# Patient Record
Sex: Male | Born: 1955 | Race: Asian | Hispanic: No | Marital: Married | Smoking: Never smoker
Health system: Southern US, Community
[De-identification: ages and names within clinical notes are randomized; demographics above are authoritative.]

## PROBLEM LIST (undated history)

## (undated) DIAGNOSIS — Z789 Other specified health status: Secondary | ICD-10-CM

## (undated) HISTORY — PX: NO PAST SURGERIES: SHX2092

---

## 2010-05-16 ENCOUNTER — Encounter: Payer: Self-pay | Admitting: Family Medicine

## 2010-05-16 ENCOUNTER — Ambulatory Visit (INDEPENDENT_AMBULATORY_CARE_PROVIDER_SITE_OTHER): Payer: Self-pay | Admitting: Family Medicine

## 2010-05-16 DIAGNOSIS — H8110 Benign paroxysmal vertigo, unspecified ear: Secondary | ICD-10-CM

## 2010-05-18 ENCOUNTER — Telehealth (INDEPENDENT_AMBULATORY_CARE_PROVIDER_SITE_OTHER): Payer: Self-pay | Admitting: *Deleted

## 2010-05-25 NOTE — Assessment & Plan Note (Signed)
Summary: feeling anxious/tm(rm4)   Vital Signs:  Patient Profile:   55 Years Old Male CC:      not feeling well/ infection in blood? Height:     72 inches Weight:      255.8 pounds O2 Sat:      95 % O2 treatment:    Room Air Temp:     97.9 degrees F oral Pulse rate:   61 / minute Resp:     20 per minute BP sitting:   123 / 70  (left arm) Cuff size:   regular  Vitals Entered By: Linton Flemings RN (May 16, 2010 12:21 PM)                  Updated Prior Medication List: No Medications Current Allergies: No known allergies History of Present Illness Chief Complaint: not feeling well/ infection in blood? History of Present Illness:  Subjective:  Patient complains of awakening 3 days ago feeling mildly dizzy.  The symptoms have gradually increased and now occur with any movement.  He feels as if the room is spinning slightly and he feels off balance when walking.  No changes in vision.  No headache.  He has had mild nausea but no vomiting.  No fevers, chills, and sweats.   No neuro symptoms   REVIEW OF SYSTEMS Constitutional Symptoms      Denies fever, chills, night sweats, weight loss, weight gain, and fatigue.  Eyes       Denies change in vision, eye pain, eye discharge, glasses, contact lenses, and eye surgery. Ear/Nose/Throat/Mouth       Denies hearing loss/aids, change in hearing, ear pain, ear discharge, dizziness, frequent runny nose, frequent nose bleeds, sinus problems, sore throat, hoarseness, and tooth pain or bleeding.  Respiratory       Denies dry cough, productive cough, wheezing, shortness of breath, asthma, bronchitis, and emphysema/COPD.  Cardiovascular       Denies murmurs, chest pain, and tires easily with exhertion.    Gastrointestinal       Complains of nausea/vomiting.      Denies stomach pain, diarrhea, constipation, blood in bowel movements, and indigestion. Genitourniary       Denies painful urination, kidney stones, and loss of urinary  control. Neurological       Denies paralysis, seizures, and fainting/blackouts. Musculoskeletal       Denies muscle pain, joint pain, joint stiffness, decreased range of motion, redness, swelling, muscle weakness, and gout.  Skin       Denies bruising, unusual mles/lumps or sores, and hair/skin or nail changes.  Psych       Denies mood changes, temper/anger issues, anxiety/stress, speech problems, depression, and sleep problems. Other Comments: states for 3 days he has felt dizzyand has not been able to control his body. yesterday he tried to put his foot in a particular spot and was unable to.  Also states he noticed the feeling when he lay on right side is when he feels dizzy   Past History:  Past Medical History: Unremarkable  Past Surgical History: Denies surgical history  Family History: none  Social History: non smoker drinks-casual drugs-no   Objective:  Appearance:  Patient appears healthy, stated age, and in no acute distress  Eyes:  Pupils are equal, round, and reactive to light and accomdation.  Extraocular movement is intact.  Conjunctivae are not inflamed.  Fundi benign.  Slight nystagmus present on lateral gaze Ears:  Canals normal.  Tympanic membranes normal.  Nose:  Not congested; no sinus tenderness Pharynx:  Normal  Neck:  Supple.  No adenopathy is present.  No thyromegaly is present.  Carotids normal without bruits Heart:  Regular rate and rhythm without murmurs, rubs, or gallops.  Lungs:  Clear to auscultation.  Breath sounds are equal.  Abdomen:  Nontender without masses or hepatosplenomegaly.  Bowel sounds are present.  No CVA or flank tenderness.  Extremities:  No edema.  Pedal pulses are full and equal.  Neurologic:  Cranial nerves 2 through 12 are normal.  Patellar, achilles reflexes are normal.  Cerebellar function is intact.  Gait and station are normal.    Assessment New Problems: BENIGN POSITIONAL VERTIGO (ICD-386.11)   Plan New  Medications/Changes: MECLIZINE HCL 25 MG TABS (MECLIZINE HCL) One by mouth two times a day to three times a day as needed for dizziness  #15 x 1, 05/16/2010, Donna Christen MD  New Orders: New Patient Level III [phone] Planning Comments:   Begin antivert.  Given a Water quality scientist patient information and instruction sheet on topic  Follow-up with ENT if not improving about two weeks                                                                                                                                                                       The patient and/or caregiver has been counseled thoroughly with regard to medications prescribed including dosage, schedule, interactions, rationale for use, and possible side effects and they verbalize understanding.  Diagnoses and expected course of recovery discussed and will return if not improved as expected or if the condition worsens. Patient and/or caregiver verbalized understanding.  Prescriptions: MECLIZINE HCL 25 MG TABS (MECLIZINE HCL) One by mouth two times a day to three times a day as needed for dizziness  #15 x 1   Entered and Authorized by:   Donna Christen MD   Signed by:   Donna Christen MD on 05/16/2010   Method used:   Print then Give to Patient   RxID:   [id number]   Orders Added: 1)  New Patient Level III [95621]

## 2010-05-25 NOTE — Progress Notes (Signed)
  Phone Note Outgoing Call Call back at [facility] Phone [phone] P [facility]     Call placed by: Lajean Saver RN,  May 18, 2010 12:56 PM Call placed to: Patient Action Taken: Phone Call Completed Summary of Call: Callback: Patient reports he is feeling "much better"

## 2010-05-25 NOTE — Letter (Signed)
Summary: Out of Work  [facility]  [address]   [place], [place] [address]   Phone: [phone]  Fax: [phone]    May 16, 2010   Employee:  [employer]    To Whom It May Concern:   For Medical reasons, please excuse the above named employee from work tomorrow.      If you need additional information, please feel free to contact our office.         Sincerely,    Donna Christen MD

## 2011-11-10 ENCOUNTER — Other Ambulatory Visit: Payer: Self-pay | Admitting: Orthopedic Surgery

## 2011-11-10 ENCOUNTER — Encounter (HOSPITAL_BASED_OUTPATIENT_CLINIC_OR_DEPARTMENT_OTHER): Payer: Self-pay | Admitting: *Deleted

## 2011-11-10 NOTE — Progress Notes (Signed)
Pt here -denies any cardiac or resp problems-no surgeries-from [place]-speaks english very well-works [employer]

## 2011-11-11 ENCOUNTER — Encounter (HOSPITAL_BASED_OUTPATIENT_CLINIC_OR_DEPARTMENT_OTHER): Payer: Self-pay | Admitting: *Deleted

## 2011-11-11 ENCOUNTER — Ambulatory Visit (HOSPITAL_BASED_OUTPATIENT_CLINIC_OR_DEPARTMENT_OTHER): Admitting: *Deleted

## 2011-11-11 ENCOUNTER — Encounter (HOSPITAL_BASED_OUTPATIENT_CLINIC_OR_DEPARTMENT_OTHER)
Admission: RE | Disposition: A | Discharge status: Home / Self Care | Payer: Self-pay | Source: Ambulatory Visit | Attending: Orthopedic Surgery

## 2011-11-11 ENCOUNTER — Ambulatory Visit (HOSPITAL_BASED_OUTPATIENT_CLINIC_OR_DEPARTMENT_OTHER)
Admission: RE | Admit: 2011-11-11 | Discharge: 2011-11-11 | Disposition: A | Discharge status: Home / Self Care | Source: Ambulatory Visit | Attending: Orthopedic Surgery | Admitting: Orthopedic Surgery

## 2011-11-11 DIAGNOSIS — W19XXXA Unspecified fall, initial encounter: Secondary | ICD-10-CM | POA: Insufficient documentation

## 2011-11-11 DIAGNOSIS — S52599A Other fractures of lower end of unspecified radius, initial encounter for closed fracture: Secondary | ICD-10-CM | POA: Insufficient documentation

## 2011-11-11 HISTORY — PX: ORIF WRIST FRACTURE: SHX2133

## 2011-11-11 HISTORY — DX: Other specified health status: Z78.9

## 2011-11-11 SURGERY — OPEN REDUCTION INTERNAL FIXATION (ORIF) WRIST FRACTURE
Anesthesia: General | Site: Wrist | Laterality: Right | Wound class: Clean

## 2011-11-11 MED ORDER — LACTATED RINGERS IV SOLN
INTRAVENOUS | Status: DC
  Administered 2011-11-11: 08:00:00 via INTRAVENOUS

## 2011-11-11 MED ORDER — DEXAMETHASONE SODIUM PHOSPHATE 10 MG/ML IJ SOLN
INTRAMUSCULAR | Status: DC | PRN
  Administered 2011-11-11: 10 mg via INTRAVENOUS

## 2011-11-11 MED ORDER — BUPIVACAINE-EPINEPHRINE PF 0.5-1:200000 % IJ SOLN
INTRAMUSCULAR | Status: DC | PRN
  Administered 2011-11-11: 30 mL

## 2011-11-11 MED ORDER — ROPIVACAINE HCL 5 MG/ML IJ SOLN
INTRAMUSCULAR | Status: DC | PRN
  Administered 2011-11-11: 10 mL via EPIDURAL

## 2011-11-11 MED ORDER — CEFAZOLIN SODIUM 1-5 GM-% IV SOLN
1.0000 g | Freq: Once | INTRAVENOUS | Status: AC
  Administered 2011-11-11: 1 g via INTRAVENOUS

## 2011-11-11 MED ORDER — POVIDONE-IODINE 7.5 % EX SOLN
Freq: Once | CUTANEOUS | Status: AC
  Administered 2011-11-11: 09:00:00 via TOPICAL

## 2011-11-11 MED ORDER — LIDOCAINE HCL (CARDIAC) 20 MG/ML IV SOLN
INTRAVENOUS | Status: DC | PRN
  Administered 2011-11-11: 40 mg via INTRAVENOUS

## 2011-11-11 MED ORDER — FENTANYL CITRATE 0.05 MG/ML IJ SOLN
50.0000 ug | INTRAMUSCULAR | Status: DC | PRN
  Administered 2011-11-11: 100 ug via INTRAVENOUS

## 2011-11-11 MED ORDER — OXYCODONE HCL 5 MG/5ML PO SOLN: 5.0000 mg | Freq: Once | ORAL | Status: DC | PRN

## 2011-11-11 MED ORDER — ONDANSETRON HCL 4 MG/2ML IJ SOLN
INTRAMUSCULAR | Status: DC | PRN
  Administered 2011-11-11: 4 mg via INTRAVENOUS

## 2011-11-11 MED ORDER — HYDROMORPHONE HCL PF 1 MG/ML IJ SOLN: 0.2500 mg | INTRAMUSCULAR | Status: DC | PRN

## 2011-11-11 MED ORDER — FENTANYL CITRATE 0.05 MG/ML IJ SOLN
INTRAMUSCULAR | Status: DC | PRN
  Administered 2011-11-11: 50 ug via INTRAVENOUS

## 2011-11-11 MED ORDER — MIDAZOLAM HCL 2 MG/2ML IJ SOLN
0.5000 mg | INTRAMUSCULAR | Status: DC | PRN
  Administered 2011-11-11: 2 mg via INTRAVENOUS

## 2011-11-11 MED ORDER — DEXTROSE 5 % IV SOLN
3.0000 g | INTRAVENOUS | Status: AC
  Administered 2011-11-11: 3 g via INTRAVENOUS

## 2011-11-11 MED ORDER — OXYCODONE-ACETAMINOPHEN 5-325 MG PO TABS: 1.0000 | ORAL_TABLET | Freq: Four times a day (QID) | ORAL | Status: AC | PRN

## 2011-11-11 MED ORDER — PROPOFOL 10 MG/ML IV BOLUS
INTRAVENOUS | Status: DC | PRN
  Administered 2011-11-11: 200 mg via INTRAVENOUS

## 2011-11-11 MED ORDER — OXYCODONE HCL 5 MG PO TABS: 5.0000 mg | ORAL_TABLET | Freq: Once | ORAL | Status: DC | PRN

## 2011-11-11 SURGICAL SUPPLY — 64 items
BAG DECANTER FOR FLEXI CONT (MISCELLANEOUS) IMPLANT
BANDAGE ELASTIC 3 VELCRO ST LF (GAUZE/BANDAGES/DRESSINGS) ×2 IMPLANT
BENZOIN TINCTURE PRP APPL 2/3 (GAUZE/BANDAGES/DRESSINGS) ×2 IMPLANT
BIT DRILL 2.5X110 QC LCP DISP (BIT) ×2 IMPLANT
BLADE SURG 15 STRL LF DISP TIS (BLADE) ×2 IMPLANT
BLADE SURG 15 STRL SS (BLADE) ×2
BLADE SURG ROTATE 9660 (MISCELLANEOUS) IMPLANT
BNDG ESMARK 4X9 LF (GAUZE/BANDAGES/DRESSINGS) ×2 IMPLANT
CANISTER SUCTION 1200CC (MISCELLANEOUS) ×2 IMPLANT
CLOTH BEACON ORANGE TIMEOUT ST (SAFETY) ×2 IMPLANT
COVER MAYO STAND STRL (DRAPES) ×2 IMPLANT
COVER TABLE BACK 60X90 (DRAPES) ×2 IMPLANT
CUFF TOURNIQUET SINGLE 18IN (TOURNIQUET CUFF) ×2 IMPLANT
DECANTER SPIKE VIAL GLASS SM (MISCELLANEOUS) IMPLANT
DRAPE EXTREMITY T 121X128X90 (DRAPE) ×2 IMPLANT
DRAPE OEC MINIVIEW 54X84 (DRAPES) ×2 IMPLANT
DRAPE SURG 17X23 STRL (DRAPES) ×2 IMPLANT
DRSG EMULSION OIL 3X3 NADH (GAUZE/BANDAGES/DRESSINGS) ×2 IMPLANT
DURAPREP 26ML APPLICATOR (WOUND CARE) ×2 IMPLANT
ELECT REM PT RETURN 9FT ADLT (ELECTROSURGICAL) ×2
ELECTRODE REM PT RTRN 9FT ADLT (ELECTROSURGICAL) ×1 IMPLANT
GLOVE BIOGEL M STRL SZ7.5 (GLOVE) ×2 IMPLANT
GLOVE BIOGEL PI IND STRL 8 (GLOVE) ×3 IMPLANT
GLOVE BIOGEL PI INDICATOR 8 (GLOVE) ×3
GLOVE ECLIPSE 7.5 STRL STRAW (GLOVE) ×4 IMPLANT
GOWN BRE IMP PREV XXLGXLNG (GOWN DISPOSABLE) ×2 IMPLANT
GOWN PREVENTION PLUS XLARGE (GOWN DISPOSABLE) ×2 IMPLANT
GOWN PREVENTION PLUS XXLARGE (GOWN DISPOSABLE) ×2 IMPLANT
NEEDLE HYPO 22GX1.5 SAFETY (NEEDLE) IMPLANT
NS IRRIG 1000ML POUR BTL (IV SOLUTION) ×2 IMPLANT
PACK BASIN DAY SURGERY FS (CUSTOM PROCEDURE TRAY) ×2 IMPLANT
PAD CAST 3X4 CTTN HI CHSV (CAST SUPPLIES) ×1 IMPLANT
PADDING CAST ABS 4INX4YD NS (CAST SUPPLIES) ×1
PADDING CAST ABS COTTON 4X4 ST (CAST SUPPLIES) ×1 IMPLANT
PADDING CAST COTTON 3X4 STRL (CAST SUPPLIES) ×1
PADDING UNDERCAST 2  STERILE (CAST SUPPLIES) IMPLANT
PENCIL BUTTON HOLSTER BLD 10FT (ELECTRODE) ×2 IMPLANT
PLATE SM RT 4X4X56M (Plate) ×2 IMPLANT
SCREW CANC FT 4.0X22 (Screw) ×2 IMPLANT
SCREW CANCEL 4.0 26MM (Screw) ×4 IMPLANT
SCREW CORTEX 3.5 16MM (Screw) ×3 IMPLANT
SCREW LOCK CORT ST 3.5X16 (Screw) ×3 IMPLANT
SPLINT PLASTER CAST XFAST 3X15 (CAST SUPPLIES) ×10 IMPLANT
SPLINT PLASTER XTRA FASTSET 3X (CAST SUPPLIES) ×10
SPONGE GAUZE 4X4 12PLY (GAUZE/BANDAGES/DRESSINGS) ×2 IMPLANT
STOCKINETTE 4X48 STRL (DRAPES) ×2 IMPLANT
STRIP CLOSURE SKIN 1/2X4 (GAUZE/BANDAGES/DRESSINGS) ×2 IMPLANT
SUCTION FRAZIER TIP 10 FR DISP (SUCTIONS) ×2 IMPLANT
SUT BONE WAX W31G (SUTURE) IMPLANT
SUT MNCRL AB 3-0 PS2 18 (SUTURE) ×2 IMPLANT
SUT VIC AB 1 CT1 27 (SUTURE)
SUT VIC AB 1 CT1 27XBRD ANBCTR (SUTURE) IMPLANT
SUT VIC AB 2-0 SH 27 (SUTURE) ×1
SUT VIC AB 2-0 SH 27XBRD (SUTURE) ×1 IMPLANT
SUT VIC AB 3-0 SH 27 (SUTURE) ×1
SUT VIC AB 3-0 SH 27X BRD (SUTURE) ×1 IMPLANT
SUT VICRYL 4-0 PS2 18IN ABS (SUTURE) IMPLANT
SYR BULB 3OZ (MISCELLANEOUS) ×2 IMPLANT
SYR CONTROL 10ML LL (SYRINGE) IMPLANT
TOWEL OR 17X24 6PK STRL BLUE (TOWEL DISPOSABLE) ×2 IMPLANT
TOWEL OR NON WOVEN STRL DISP B (DISPOSABLE) ×2 IMPLANT
TUBE CONNECTING 20X1/4 (TUBING) ×2 IMPLANT
UNDERPAD 30X30 INCONTINENT (UNDERPADS AND DIAPERS) ×2 IMPLANT
WATER STERILE IRR 1000ML POUR (IV SOLUTION) IMPLANT

## 2011-11-11 NOTE — Progress Notes (Signed)
Assisted Dr. Fitzgerald with right, ultrasound guided, supraclavicular, axillary block. Side rails up, monitors on throughout procedure. See vital signs in flow sheet. Tolerated Procedure well. 

## 2011-11-11 NOTE — Brief Op Note (Signed)
11/11/2011  11:01 AM  PATIENT:  [facility]  56 y.o. male  PRE-OPERATIVE DIAGNOSIS:  fractured right wrist  POST-OPERATIVE DIAGNOSIS:  fractured right wrist  PROCEDURE:  Procedure(s) (LRB): OPEN REDUCTION INTERNAL FIXATION (ORIF) WRIST FRACTURE (Right)  SURGEON:  Surgeon(s) and Role:    * Harvie Junior, MD - Primary  PHYSICIAN ASSISTANT:   ASSISTANTS: bethune   ANESTHESIA:   general  EBL:  Total I/O In: 1000 [I.V.:1000] Out: -   BLOOD ADMINISTERED:none  DRAINS: none   LOCAL MEDICATIONS USED:  MARCAINE     SPECIMEN:  No Specimen  DISPOSITION OF SPECIMEN:  N/A  COUNTS:  YES  TOURNIQUET:   Total Tourniquet Time Documented: Upper Arm (Right) - 71 minutes  DICTATION: .Other Dictation: Dictation Number [id number]  PLAN OF CARE: Discharge to home after PACU  PATIENT DISPOSITION:  PACU - hemodynamically stable.   Delay start of Pharmacological VTE agent (>24hrs) due to surgical blood loss or risk of bleeding: not applicable

## 2011-11-11 NOTE — Transfer of Care (Signed)
Immediate Anesthesia Transfer of Care Note  Patient: [facility]  Procedure(s) Performed: Procedure(s) (LRB): OPEN REDUCTION INTERNAL FIXATION (ORIF) WRIST FRACTURE (Right)  Patient Location: PACU  Anesthesia Type: GA combined with regional for post-op pain  Level of Consciousness: awake, alert  and oriented  Airway & Oxygen Therapy: Patient Spontanous Breathing and Patient connected to face mask oxygen  Post-op Assessment: Report given to PACU RN, Post -op Vital signs reviewed and stable and Patient moving all extremities  Post vital signs: Reviewed and stable  Complications: No apparent anesthesia complications

## 2011-11-11 NOTE — Anesthesia Postprocedure Evaluation (Signed)
  Anesthesia Post-op Note  Patient: [facility]  Procedure(s) Performed: Procedure(s) (LRB): OPEN REDUCTION INTERNAL FIXATION (ORIF) WRIST FRACTURE (Right)  Patient Location: PACU  Anesthesia Type: GA combined with regional for post-op pain  Level of Consciousness: awake and alert   Airway and Oxygen Therapy: Patient Spontanous Breathing  Post-op Pain: none  Post-op Assessment: Post-op Vital signs reviewed, Patient's Cardiovascular Status Stable, Respiratory Function Stable, Patent Airway and No signs of Nausea or vomiting  Post-op Vital Signs: Reviewed and stable  Complications: No apparent anesthesia complications

## 2011-11-11 NOTE — Anesthesia Procedure Notes (Addendum)
Anesthesia Regional Block:  Supraclavicular block  Pre-Anesthetic Checklist: ,, timeout performed, Correct Patient, Correct Site, Correct Laterality, Correct Procedure, Correct Position, site marked, Risks and benefits discussed, pre-op evaluation, post-op pain management  Laterality: Right  Prep: Maximum Sterile Barrier Precautions used and chloraprep       Needles:  Injection technique: Single-shot  Needle Type: Echogenic Stimulator Needle      Needle Gauge: 22 and 22 G    Additional Needles:  Procedures: ultrasound guided and nerve stimulator Supraclavicular block Narrative:  Injection made incrementally with aspirations every 5 mL. Anesthesiologist: Fitzgerald,MD  Additional Notes: 2% Lidocaine skin wheel. Intercostobrachial with 10cc of 0.5% Ropivicaine.  Supraclavicular block Procedure Name: LMA Insertion Date/Time: 11/11/2011 9:35 AM Performed by: Meyer Russel Pre-anesthesia Checklist: Patient identified, Emergency Drugs available, Suction available and Patient being monitored Patient Re-evaluated:Patient Re-evaluated prior to inductionOxygen Delivery Method: Circle System Utilized Preoxygenation: Pre-oxygenation with 100% oxygen Intubation Type: IV induction Ventilation: Mask ventilation without difficulty LMA: LMA inserted LMA Size: 5.0 Number of attempts: 1 Airway Equipment and Method: bite block Placement Confirmation: positive ETCO2 and breath sounds checked- equal and bilateral Tube secured with: Tape Dental Injury: Teeth and Oropharynx as per pre-operative assessment

## 2011-11-11 NOTE — Anesthesia Preprocedure Evaluation (Addendum)
Anesthesia Evaluation  Patient identified by MRN, date of birth, ID band Patient awake    Reviewed: Allergy & Precautions, H&P , NPO status , Patient's Chart, lab work & pertinent test results  Airway Mallampati: III TM Distance: >3 FB Neck ROM: Full    Dental No notable dental hx. (+) Teeth Intact and Dental Advisory Given   Pulmonary neg pulmonary ROS,  breath sounds clear to auscultation  Pulmonary exam normal       Cardiovascular negative cardio ROS  Rhythm:Regular Rate:Normal     Neuro/Psych negative neurological ROS  negative psych ROS   GI/Hepatic negative GI ROS, Neg liver ROS,   Endo/Other  negative endocrine ROS  Renal/GU negative Renal ROS  negative genitourinary   Musculoskeletal   Abdominal   Peds  Hematology negative hematology ROS (+)   Anesthesia Other Findings   Reproductive/Obstetrics negative OB ROS                           Anesthesia Physical Anesthesia Plan  ASA: II  Anesthesia Plan: General   Post-op Pain Management:    Induction: Intravenous  Airway Management Planned: LMA  Additional Equipment:   Intra-op Plan:   Post-operative Plan: Extubation in OR  Informed Consent: I have reviewed the patients History and Physical, chart, labs and discussed the procedure including the risks, benefits and alternatives for the proposed anesthesia with the patient or authorized representative who has indicated his/her understanding and acceptance.   Dental advisory given  Plan Discussed with: CRNA  Anesthesia Plan Comments:         Anesthesia Quick Evaluation  

## 2011-11-11 NOTE — H&P (Signed)
PREOPERATIVE H&P  Chief Complaint: r. Wrist pain  HPI: [facility] is a 56 y.o. male who presents for evaluation of r. Wrist pain. It has been present for 2 days and has been worsening.  Pt fell at work and fractured his r. Wrist. He has failed conservative measures. Pain is rated as moderate.  Past Medical History  Diagnosis Date  . No pertinent past medical history    Past Surgical History  Procedure Date  . No past surgeries    History   Social History  . Marital Status: Married    Spouse Name: N/A    Number of Children: N/A  . Years of Education: N/A   Social History Main Topics  . Smoking status: Never Smoker   . Smokeless tobacco: None  . Alcohol Use: Yes     occ  . Drug Use: No  . Sexually Active:    Other Topics Concern  . None   Social History Narrative  . None   History reviewed. No pertinent family history. No Known Allergies Prior to Admission medications   Medication Sig Start Date End Date Taking? Authorizing Provider  HYDROcodone-acetaminophen (NORCO/VICODIN) 5-325 MG per tablet Take 1 tablet by mouth every 6 (six) hours as needed.   Yes Historical Provider, MD     Positive ROS: none  All other systems have been reviewed and were otherwise negative with the exception of those mentioned in the HPI and as above.  Physical Exam: Filed Vitals:   11/11/11 0833  BP:   Pulse: 81  Temp:   Resp: 26    General: Alert, no acute distress Cardiovascular: No pedal edema Respiratory: No cyanosis, no use of accessory musculature GI: No organomegaly, abdomen is soft and non-tender Skin: No lesions in the area of chief complaint Neurologic: Sensation intact distally Psychiatric: Patient is competent for consent with normal mood and affect Lymphatic: No axillary or cervical lymphadenopathy  MUSCULOSKELETAL: r. Wrist:  Painful rom +sts over wrist  Assessment/Plan: fractured right wrist Plan for Procedure(s): OPEN REDUCTION INTERNAL FIXATION (ORIF)  WRIST FRACTURE  The risks benefits and alternatives were discussed with the patient including but not limited to the risks of nonoperative treatment, versus surgical intervention including infection, bleeding, nerve injury, malunion, nonunion, hardware prominence, hardware failure, need for hardware removal, blood clots, cardiopulmonary complications, morbidity, mortality, among others, and they were willing to proceed.  Predicted outcome is good, although there will be at least a six to nine month expected recovery.  Shakina Choy L, MD 11/11/2011 9:21 AM rwrist pain

## 2011-11-15 ENCOUNTER — Encounter (HOSPITAL_BASED_OUTPATIENT_CLINIC_OR_DEPARTMENT_OTHER): Payer: Self-pay | Admitting: Orthopedic Surgery

## 2011-11-15 NOTE — Op Note (Signed)
NAME:  [facility], [facility]               ACCOUNT NO.:  [id number]  MEDICAL RECORD NO.:  [id number]  LOCATION:                                 FACILITY:  PHYSICIAN:  Harvie Junior, M.D.        DATE OF BIRTH:  DATE OF PROCEDURE:  11/11/2011 DATE OF DISCHARGE:                              OPERATIVE REPORT   PREOPERATIVE DIAGNOSIS:  Comminuted intra-articular distal radius fracture.  POSTOPERATIVE DIAGNOSES: 1. Comminuted intra-articular distal radius fracture. 2. 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  PROCEDURE:  Open reduction and internal fixation of malunited distal radius fracture with a Synthes T-plate.  SURGEON:  Harvie Junior, M.D.  ASSISTANT:  Marshia Ly, PA  ANESTHESIA:  General.  BRIEF HISTORY:  Mr. Levingston is a 56 year old male with a history of having fallen and fractured his wrist.  X-ray showed what looked to be an old malunited wrist fracture with intra-articular extension of his new fracture fragments.  We talked to him at length.  He said he had not had any previous history of injury to his wrists.  At that point, I felt that it could just be impaction of the distal radius fracture.  We again quizzed him about this and his wife and he denied any previous history of injury to his wrist.  He was brought to the operating room for reduction and internal fixation of his intra-articular distal radius fracture.  PROCEDURE:  The patient was taken to the operating room and after adequate level of anesthesia was obtained with general anesthetic, the patient was placed supine on the operating table.  The right wrist was then prepped and draped in usual sterile fashion.  Following this, the arm was exsanguinated, blood pressure tourniquet inflated to 250 mmHg. Following this, an incision was made over the FCR tendon and the subcutaneous tissue was dissected down to the level of the FCR tendon. FCR tendon sheath was exploited and then radial to the sheath, we opened the  fascia, retracted muscles, and then got down to the palmaris quadratus which was divided on its radial border and distal radius was identified.  The palmaris was somewhat attached to the distal radius and there was an obvious malunion of the distal radius as we had somewhat anticipated looking at his previous films.  At that point, we knew that the standard DVR plate was not going to be effective in this fracture and so we opened the Synthes plating of the T-plate out and got one that we could mold to the previous malunion of his distal radius.  Once this was done, we were just going to put it as a buttress plate, but we felt that we could get some screws into that distal fragment might give him with some additional stability and we were able to get 3 cancellous screws in that distal fragment and put 3 cortical screws in the proximal fragment and this is after we sort of bent the plate to contour down to his distal radius.  Intraoperative fluoroscopy showed anatomic reduction with anatomic fit of this new plate to the distal radius.  At this point, the wound was copiously irrigated and suctioned dry.  Final images were taken.  The palmaris quadratus was repaired with 3-0 Vicryl interrupted and then the skin with 2-0 Vicryl, and 3-0 Monocryl subcuticular.  Benzoin and Steri- Strips were applied.  Sterile compressive dressing was applied and the patient was taken to recovery and noted to be in satisfactory condition. Estimated blood loss for the procedure was none.     Harvie Junior, M.D.     Ranae Plumber  D:  11/11/2011  T:  11/12/2011  Job:  [id number]

## 2012-07-19 ENCOUNTER — Other Ambulatory Visit: Payer: Self-pay | Admitting: Orthopedic Surgery

## 2012-07-23 ENCOUNTER — Encounter (HOSPITAL_BASED_OUTPATIENT_CLINIC_OR_DEPARTMENT_OTHER): Payer: Self-pay | Admitting: *Deleted

## 2012-07-23 NOTE — Progress Notes (Signed)
Pt here 8/13 for fx wrist-did well-no meds-no labs needed-denies any ht or resp problems

## 2012-07-27 ENCOUNTER — Encounter (HOSPITAL_BASED_OUTPATIENT_CLINIC_OR_DEPARTMENT_OTHER): Payer: Self-pay | Admitting: Certified Registered Nurse Anesthetist

## 2012-07-27 ENCOUNTER — Ambulatory Visit (HOSPITAL_BASED_OUTPATIENT_CLINIC_OR_DEPARTMENT_OTHER): Admitting: Certified Registered Nurse Anesthetist

## 2012-07-27 ENCOUNTER — Ambulatory Visit (HOSPITAL_BASED_OUTPATIENT_CLINIC_OR_DEPARTMENT_OTHER)
Admission: RE | Admit: 2012-07-27 | Discharge: 2012-07-27 | Disposition: A | Discharge status: Home / Self Care | Source: Ambulatory Visit | Attending: Orthopedic Surgery | Admitting: Orthopedic Surgery

## 2012-07-27 ENCOUNTER — Encounter (HOSPITAL_BASED_OUTPATIENT_CLINIC_OR_DEPARTMENT_OTHER)
Admission: RE | Disposition: A | Discharge status: Home / Self Care | Payer: Self-pay | Source: Ambulatory Visit | Attending: Orthopedic Surgery

## 2012-07-27 DIAGNOSIS — M25569 Pain in unspecified knee: Secondary | ICD-10-CM | POA: Insufficient documentation

## 2012-07-27 DIAGNOSIS — M6750 Plica syndrome, unspecified knee: Secondary | ICD-10-CM | POA: Insufficient documentation

## 2012-07-27 DIAGNOSIS — M9420 Chondromalacia, unspecified site: Secondary | ICD-10-CM | POA: Insufficient documentation

## 2012-07-27 DIAGNOSIS — M2240 Chondromalacia patellae, unspecified knee: Secondary | ICD-10-CM | POA: Insufficient documentation

## 2012-07-27 HISTORY — PX: KNEE ARTHROSCOPY: SHX127

## 2012-07-27 HISTORY — DX: Other specified health status: Z78.9

## 2012-07-27 SURGERY — ARTHROSCOPY, KNEE
Anesthesia: General | Laterality: Right | Wound class: Clean

## 2012-07-27 MED ORDER — OXYCODONE HCL 5 MG/5ML PO SOLN: 5.0000 mg | Freq: Once | ORAL | Status: DC | PRN

## 2012-07-27 MED ORDER — DEXAMETHASONE SODIUM PHOSPHATE 4 MG/ML IJ SOLN
INTRAMUSCULAR | Status: DC | PRN
  Administered 2012-07-27: 10 mg via INTRAVENOUS

## 2012-07-27 MED ORDER — POVIDONE-IODINE 7.5 % EX SOLN: Freq: Once | CUTANEOUS | Status: DC

## 2012-07-27 MED ORDER — SODIUM CHLORIDE 0.9 % IR SOLN
Status: DC | PRN
  Administered 2012-07-27: 12:00:00

## 2012-07-27 MED ORDER — BUPIVACAINE HCL (PF) 0.5 % IJ SOLN
INTRAMUSCULAR | Status: DC | PRN
  Administered 2012-07-27: 20 mL

## 2012-07-27 MED ORDER — ACETAMINOPHEN 10 MG/ML IV SOLN
1000.0000 mg | Freq: Once | INTRAVENOUS | Status: AC
  Administered 2012-07-27: 1000 mg via INTRAVENOUS

## 2012-07-27 MED ORDER — LIDOCAINE HCL (CARDIAC) 20 MG/ML IV SOLN
INTRAVENOUS | Status: DC | PRN
  Administered 2012-07-27: 60 mg via INTRAVENOUS

## 2012-07-27 MED ORDER — MIDAZOLAM HCL 5 MG/5ML IJ SOLN
INTRAMUSCULAR | Status: DC | PRN
  Administered 2012-07-27: 2 mg via INTRAVENOUS

## 2012-07-27 MED ORDER — LACTATED RINGERS IV SOLN
INTRAVENOUS | Status: DC
  Administered 2012-07-27 (×2): via INTRAVENOUS

## 2012-07-27 MED ORDER — CEFAZOLIN SODIUM-DEXTROSE 2-3 GM-% IV SOLR
2.0000 g | INTRAVENOUS | Status: AC
  Administered 2012-07-27: 2 g via INTRAVENOUS

## 2012-07-27 MED ORDER — PROPOFOL 10 MG/ML IV BOLUS
INTRAVENOUS | Status: DC | PRN
  Administered 2012-07-27: 200 mg via INTRAVENOUS

## 2012-07-27 MED ORDER — OXYCODONE-ACETAMINOPHEN 5-325 MG PO TABS: 1.0000 | ORAL_TABLET | Freq: Four times a day (QID) | ORAL | Status: DC | PRN

## 2012-07-27 MED ORDER — FENTANYL CITRATE 0.05 MG/ML IJ SOLN
INTRAMUSCULAR | Status: DC | PRN
  Administered 2012-07-27: 25 ug via INTRAVENOUS
  Administered 2012-07-27: 100 ug via INTRAVENOUS

## 2012-07-27 MED ORDER — OXYCODONE HCL 5 MG PO TABS: 5.0000 mg | ORAL_TABLET | Freq: Once | ORAL | Status: DC | PRN

## 2012-07-27 MED ORDER — HYDROMORPHONE HCL PF 1 MG/ML IJ SOLN: 0.2500 mg | INTRAMUSCULAR | Status: DC | PRN

## 2012-07-27 SURGICAL SUPPLY — 37 items
BANDAGE ELASTIC 6 VELCRO ST LF (GAUZE/BANDAGES/DRESSINGS) ×2 IMPLANT
BLADE 4.2CUDA (BLADE) IMPLANT
BLADE GREAT WHITE 4.2 (BLADE) ×2 IMPLANT
CANISTER OMNI JUG 16 LITER (MISCELLANEOUS) IMPLANT
CANISTER SUCTION 2500CC (MISCELLANEOUS) IMPLANT
CLOTH BEACON ORANGE TIMEOUT ST (SAFETY) ×2 IMPLANT
CUTTER MENISCUS  4.2MM (BLADE)
CUTTER MENISCUS 4.2MM (BLADE) IMPLANT
DRAPE ARTHROSCOPY W/POUCH 114 (DRAPES) ×2 IMPLANT
DRSG EMULSION OIL 3X3 NADH (GAUZE/BANDAGES/DRESSINGS) ×2 IMPLANT
DURAPREP 26ML APPLICATOR (WOUND CARE) ×2 IMPLANT
ELECT MENISCUS 165MM 90D (ELECTRODE) IMPLANT
ELECT REM PT RETURN 9FT ADLT (ELECTROSURGICAL)
ELECTRODE REM PT RTRN 9FT ADLT (ELECTROSURGICAL) IMPLANT
GLOVE BIOGEL PI IND STRL 8 (GLOVE) ×2 IMPLANT
GLOVE BIOGEL PI INDICATOR 8 (GLOVE) ×2
GLOVE ECLIPSE 7.5 STRL STRAW (GLOVE) ×4 IMPLANT
GOWN BRE IMP PREV XXLGXLNG (GOWN DISPOSABLE) ×2 IMPLANT
GOWN PREVENTION PLUS XLARGE (GOWN DISPOSABLE) ×2 IMPLANT
GOWN PREVENTION PLUS XXLARGE (GOWN DISPOSABLE) ×2 IMPLANT
HOLDER KNEE FOAM BLUE (MISCELLANEOUS) ×2 IMPLANT
KNEE WRAP E Z 3 GEL PACK (MISCELLANEOUS) ×2 IMPLANT
NDL SAFETY ECLIPSE 18X1.5 (NEEDLE) IMPLANT
NEEDLE HYPO 18GX1.5 SHARP (NEEDLE)
PACK ARTHROSCOPY DSU (CUSTOM PROCEDURE TRAY) ×2 IMPLANT
PACK BASIN DAY SURGERY FS (CUSTOM PROCEDURE TRAY) ×2 IMPLANT
PAD CAST 4YDX4 CTTN HI CHSV (CAST SUPPLIES) ×1 IMPLANT
PADDING CAST COTTON 4X4 STRL (CAST SUPPLIES) ×1
PENCIL BUTTON HOLSTER BLD 10FT (ELECTRODE) IMPLANT
SET ARTHROSCOPY TUBING (MISCELLANEOUS) ×1
SET ARTHROSCOPY TUBING LN (MISCELLANEOUS) ×1 IMPLANT
SPONGE GAUZE 4X4 12PLY (GAUZE/BANDAGES/DRESSINGS) ×2 IMPLANT
SUT ETHILON 4 0 PS 2 18 (SUTURE) IMPLANT
SYR 5ML LL (SYRINGE) ×2 IMPLANT
TOWEL OR 17X24 6PK STRL BLUE (TOWEL DISPOSABLE) ×2 IMPLANT
TOWEL OR NON WOVEN STRL DISP B (DISPOSABLE) ×2 IMPLANT
WATER STERILE IRR 1000ML POUR (IV SOLUTION) ×2 IMPLANT

## 2012-07-27 NOTE — Anesthesia Preprocedure Evaluation (Addendum)
Anesthesia Evaluation  Patient identified by MRN, date of birth, ID band Patient awake    Reviewed: Allergy & Precautions, H&P , NPO status , Patient's Chart, lab work & pertinent test results  Airway Mallampati: II TM Distance: >3 FB Neck ROM: Full    Dental no notable dental hx. (+) Teeth Intact and Dental Advisory Given   Pulmonary neg pulmonary ROS,  breath sounds clear to auscultation  Pulmonary exam normal       Cardiovascular negative cardio ROS  Rhythm:Regular Rate:Normal     Neuro/Psych negative neurological ROS  negative psych ROS   GI/Hepatic negative GI ROS, Neg liver ROS,   Endo/Other  negative endocrine ROS  Renal/GU negative Renal ROS  negative genitourinary   Musculoskeletal   Abdominal   Peds  Hematology negative hematology ROS (+)   Anesthesia Other Findings   Reproductive/Obstetrics negative OB ROS                           Anesthesia Physical Anesthesia Plan  ASA: II  Anesthesia Plan: General   Post-op Pain Management:    Induction: Intravenous  Airway Management Planned: LMA  Additional Equipment:   Intra-op Plan:   Post-operative Plan: Extubation in OR  Informed Consent: I have reviewed the patients History and Physical, chart, labs and discussed the procedure including the risks, benefits and alternatives for the proposed anesthesia with the patient or authorized representative who has indicated his/her understanding and acceptance.   Dental advisory given  Plan Discussed with: CRNA  Anesthesia Plan Comments:         Anesthesia Quick Evaluation  

## 2012-07-27 NOTE — Brief Op Note (Signed)
07/27/2012  12:18 PM  PATIENT:  Marquinn Molter  57 y.o. male  PRE-OPERATIVE DIAGNOSIS:  chondral defect, medial femoral chondyle right knee  POST-OPERATIVE DIAGNOSIS:  chondral defect, medial femoral chondyle right knee  PROCEDURE:  Procedure(s): ARTHROSCOPY KNEE, CHONDROPLASTY MEDIAL COMPARTMENT, MEDIAL PATELLA COMPARTMENT, EXCISION MEDIAL PLICA  (Right)  SURGEON:  Surgeon(s) and Role:    * Harvie Junior, MD - Primary  PHYSICIAN ASSISTANT:   ASSISTANTS: bethune   ANESTHESIA:   general  EBL:  Total I/O In: 1000 [I.V.:1000] Out: -   BLOOD ADMINISTERED:none  DRAINS: none   LOCAL MEDICATIONS USED:  MARCAINE     SPECIMEN:  No Specimen  DISPOSITION OF SPECIMEN:  N/A  COUNTS:  YES  TOURNIQUET:  * No tourniquets in log *  DICTATION: .Other Dictation: Dictation Number [id number]  PLAN OF CARE: Discharge to home after PACU  PATIENT DISPOSITION:  PACU - hemodynamically stable.   Delay start of Pharmacological VTE agent (>24hrs) due to surgical blood loss or risk of bleeding: no

## 2012-07-27 NOTE — H&P (Signed)
  PREOPERATIVE H&P  Chief Complaint: r knee pain  HPI: Patrick [facility] is a 57 y.o. male who presents for evaluation of r knee pain. It has been present for greater than 3 months and has been worsening. He has failed conservative measures. Pain is rated as moderate.  Past Medical History  Diagnosis Date  . No pertinent past medical history   . Medical history non-contributory    Past Surgical History  Procedure Laterality Date  . No past surgeries    . Orif wrist fracture  11/11/2011    Procedure: OPEN REDUCTION INTERNAL FIXATION (ORIF) WRIST FRACTURE;  Surgeon: Patrick Junior, MD;  Location: [facility];  Service: Orthopedics;  Laterality: Right;  orif right wrist   History   Social History  . Marital Status: Married    Spouse Name: N/A    Number of Children: N/A  . Years of Education: N/A   Social History Main Topics  . Smoking status: Never Smoker   . Smokeless tobacco: None  . Alcohol Use: Yes     Comment: occ  . Drug Use: No  . Sexually Active: None   Other Topics Concern  . None   Social History Narrative  . None   History reviewed. No pertinent family history. No Known Allergies Prior to Admission medications   Medication Sig Start Date End Date Taking? Authorizing Provider  HYDROcodone-acetaminophen (VICODIN) 5-500 MG per tablet Take 1 tablet by mouth every 6 (six) hours as needed for pain.   Yes Historical Provider, MD     Positive ROS: none  All other systems have been reviewed and were otherwise negative with the exception of those mentioned in the HPI and as above.  Physical Exam: Filed Vitals:   07/27/12 0940  BP: 120/79  Pulse: 60  Temp: 98.4 F (36.9 C)  Resp: 18    General: Alert, no acute distress Cardiovascular: No pedal edema Respiratory: No cyanosis, no use of accessory musculature GI: No organomegaly, abdomen is soft and non-tender Skin: No lesions in the area of chief complaint Neurologic: Sensation intact  distally Psychiatric: Patient is competent for consent with normal mood and affect Lymphatic: No axillary or cervical lymphadenopathy  MUSCULOSKELETAL: r knee: +med jt line tender // +compression and inhibition  Assessment/Plan: chondral defect, medial femoral chondyle right knee with med side pain Plan for Procedure(s): ARTHROSCOPY KNEE  The risks benefits and alternatives were discussed with the patient including but not limited to the risks of nonoperative treatment, versus surgical intervention including infection, bleeding, nerve injury, malunion, nonunion, hardware prominence, hardware failure, need for hardware removal, blood clots, cardiopulmonary complications, morbidity, mortality, among others, and they were willing to proceed.  Predicted outcome is good, although there will be at least a six to nine month expected recovery.  [facility] L, MD 07/27/2012 11:27 AM

## 2012-07-27 NOTE — Anesthesia Procedure Notes (Signed)
Procedure Name: LMA Insertion Date/Time: 07/27/2012 11:42 AM Performed by: Karsynn Deweese D Pre-anesthesia Checklist: Patient identified, Emergency Drugs available, Suction available and Patient being monitored Patient Re-evaluated:Patient Re-evaluated prior to inductionOxygen Delivery Method: Circle System Utilized Preoxygenation: Pre-oxygenation with 100% oxygen Intubation Type: IV induction Ventilation: Mask ventilation without difficulty LMA: LMA inserted LMA Size: 5.0 Number of attempts: 1 Airway Equipment and Method: bite block Placement Confirmation: positive ETCO2 Tube secured with: Tape Dental Injury: Teeth and Oropharynx as per pre-operative assessment

## 2012-07-27 NOTE — Anesthesia Postprocedure Evaluation (Signed)
Anesthesia Post Note  Patient: Patrick Hill  Procedure(s) Performed: Procedure(s) (LRB): ARTHROSCOPY KNEE, CHONDROPLASTY MEDIAL COMPARTMENT, MEDIAL PATELLA COMPARTMENT, EXCISION MEDIAL PLICA  (Right)  Anesthesia type: general  Patient location: PACU  Post pain: Pain level controlled  Post assessment: Patient's Cardiovascular Status Stable  Last Vitals:  Filed Vitals:   07/27/12 1330  BP: 142/97  Pulse: 57  Temp:   Resp: 18    Post vital signs: Reviewed and stable  Level of consciousness: sedated  Complications: No apparent anesthesia complications

## 2012-07-27 NOTE — Transfer of Care (Signed)
Immediate Anesthesia Transfer of Care Note  Patient: Patrick Hill  Procedure(s) Performed: Procedure(s): ARTHROSCOPY KNEE, CHONDROPLASTY MEDIAL COMPARTMENT, MEDIAL PATELLA COMPARTMENT, EXCISION MEDIAL PLICA  (Right)  Patient Location: PACU  Anesthesia Type:General  Level of Consciousness: awake and patient cooperative  Airway & Oxygen Therapy: Patient Spontanous Breathing and Patient connected to face mask oxygen  Post-op Assessment: Report given to PACU RN and Post -op Vital signs reviewed and stable  Post vital signs: Reviewed and stable  Complications: No apparent anesthesia complications

## 2012-07-30 NOTE — Op Note (Signed)
NAMEMANAN, OLMO               ACCOUNT NO.:  [id number]  MEDICAL RECORD NO.:  [id number]  LOCATION:                                 FACILITY:  PHYSICIAN:  Harvie Junior, M.D.   DATE OF BIRTH:  1956-03-12  DATE OF PROCEDURE:  07/27/2012 DATE OF DISCHARGE:                              OPERATIVE REPORT   PREOPERATIVE DIAGNOSIS:  Chondral defect, medial femoral condyle with failure of conservative care.  POSTOPERATIVE DIAGNOSES: 1. Severe chondromalacia of the medial compartment with bone-on-bone     changes over a large area in the medial compartment. 2. Medial shelf plica. 3. Chondromalacia of the patellofemoral joint.  PRINCIPAL PROCEDURES: 1. Chondroplasty of medial femoral condyle down to bleeding bone. 2. Chondroplasty of patellofemoral joint down to bleeding bone. 3. Debridement of medial shelf plica.  SURGEON:  Harvie Junior, M.D.  ASSISTANT:  Marshia Ly, P.A.  ANESTHESIA:  General.  BRIEF HISTORY:  Mr. Elie is a 57 year old male with a history of having significant complaints of right knee pain.  We have treated him conservatively for a period of time.  He had had a work-related injury. X-ray showed narrowing, but not bone-on-bone changes.  MRI showed chondral thinning and edema in the bone.  After long discussion of treatment options, we felt that arthroscopy would be reasonable to address the cartilaginous lesions, the microfracture was necessary and he was brought to the operating room for this procedure.  PROCEDURE:  The patient was brought to the operating room.  After adequate level of anesthesia was obtained with general anesthetic, the patient was placed supine on the operating table.  The right leg was then prepped and draped in usual sterile fashion.  Following this, routine arthroscopic examination of the knee revealed there was significant chondromalacia of the patellofemoral joint, was debrided down to bleeding bone and areas.  Attention  was turned to the medial compartment, block by a large draping medial plica, which was debrided. There were some significant osteophytes for the medial compartment. Attention continued over medially where we got into the medial compartment, and unfortunately, there was a large 3 x 4 cm area of exposed bone on the medial femoral condyle and the medial tibial plateau.  There was about a 2 x 2 area of exposed bone.  The medial meniscus was within normal limits.  There were edge cracking of the cartilage, which we cleaned up, went to the ACL and normal lateral side showed no severe evidence of chondromalacia.  At this point, the knee was copiously and thoroughly lavaged and suctioned dry.  The arthroscopic portals were closed with bandage.  Sterile compressive dressing was applied.  The patient was taken to the recovery room, he was noted to be in satisfactory condition.  Estimated blood loss for the procedure was none.     Harvie Junior, M.D.     Ranae Plumber  D:  07/27/2012  T:  07/28/2012  Job:  [id number]

## 2016-08-13 HISTORY — PX: BACK SURGERY: SHX140

## 2016-10-04 ENCOUNTER — Other Ambulatory Visit: Payer: Self-pay | Admitting: Neurosurgery

## 2016-11-04 NOTE — Pre-Procedure Instructions (Signed)
    Patrick Hill  11/04/2016     No Pharmacies Listed   Your procedure is scheduled on Wednesday August 29.  Report to [facility] Admitting at 08:00 A.M.  Call this number if you have problems the morning of surgery:  [phone]   Remember:  Do not eat food or drink liquids after midnight.  Take these medicines the morning of surgery with A SIP OF WATER: oxycodone (Percocet) if needed  7 days prior to surgery STOP taking any Aspirin, Aleve, Naproxen, Ibuprofen, Motrin, Advil, Goody's, BC's, all herbal medications, fish oil, and all vitamins    Do not wear jewelry, make-up or nail polish.  Do not wear lotions, powders, or perfumes, or deoderant.  Do not shave 48 hours prior to surgery.  Men may shave face and neck.  Do not bring valuables to the hospital.  [facility] is not responsible for any belongings or valuables.  Contacts, dentures or bridgework may not be worn into surgery.  Leave your suitcase in the car.  After surgery it may be brought to your room.  For patients admitted to the hospital, discharge time will be determined by your treatment team.  Patients discharged the day of surgery will not be allowed to drive home.    Special instructions:    [facility]- Preparing For Surgery  Before surgery, you can play an important role. Because skin is not sterile, your skin needs to be as free of germs as possible. You can reduce the number of germs on your skin by washing with CHG (chlorahexidine gluconate) Soap before surgery.  CHG is an antiseptic cleaner which kills germs and bonds with the skin to continue killing germs even after washing.  Please do not use if you have an allergy to CHG or antibacterial soaps. If your skin becomes reddened/irritated stop using the CHG.  Do not shave (including legs and underarms) for at least 48 hours prior to first CHG shower. It is OK to shave your face.  Please follow these instructions carefully.   1. Shower the  NIGHT BEFORE SURGERY and the MORNING OF SURGERY with CHG.   2. If you chose to wash your hair, wash your hair first as usual with your normal shampoo.  3. After you shampoo, rinse your hair and body thoroughly to remove the shampoo.  4. Use CHG as you would any other liquid soap. You can apply CHG directly to the skin and wash gently with a scrungie or a clean washcloth.   5. Apply the CHG Soap to your body ONLY FROM THE NECK DOWN.  Do not use on open wounds or open sores. Avoid contact with your eyes, ears, mouth and genitals (private parts). Wash genitals (private parts) with your normal soap.  6. Wash thoroughly, paying special attention to the area where your surgery will be performed.  7. Thoroughly rinse your body with warm water from the neck down.  8. DO NOT shower/wash with your normal soap after using and rinsing off the CHG Soap.  9. Pat yourself dry with a CLEAN TOWEL.   10. Wear CLEAN PAJAMAS   11. Place CLEAN SHEETS on your bed the night of your first shower and DO NOT SLEEP WITH PETS.    Day of Surgery: Do not apply any deodorants/lotions. Please wear clean clothes to the hospital/surgery center.      Please read over the following fact sheets that you were given. MRSA Information

## 2016-11-07 ENCOUNTER — Encounter (HOSPITAL_COMMUNITY): Payer: Self-pay

## 2016-11-07 ENCOUNTER — Encounter (HOSPITAL_COMMUNITY)
Admission: RE | Admit: 2016-11-07 | Discharge: 2016-11-07 | Disposition: A | Discharge status: Home / Self Care | Source: Ambulatory Visit | Attending: Neurosurgery | Admitting: Neurosurgery

## 2016-11-07 DIAGNOSIS — Z01812 Encounter for preprocedural laboratory examination: Secondary | ICD-10-CM | POA: Insufficient documentation

## 2016-11-07 LAB — CBC
HCT: 43.8 % (ref 39.0–52.0)
Hemoglobin: 14.2 g/dL (ref 13.0–17.0)
MCH: 30 pg (ref 26.0–34.0)
MCHC: 32.4 g/dL (ref 30.0–36.0)
MCV: 92.4 fL (ref 78.0–100.0)
Platelets: 189 10*3/uL (ref 150–400)
RBC: 4.74 MIL/uL (ref 4.22–5.81)
RDW: 13.2 % (ref 11.5–15.5)
WBC: 4.4 10*3/uL (ref 4.0–10.5)

## 2016-11-07 LAB — BASIC METABOLIC PANEL
ANION GAP: 6 (ref 5–15)
BUN: 12 mg/dL (ref 6–20)
CHLORIDE: 111 mmol/L (ref 101–111)
CO2: 23 mmol/L (ref 22–32)
Calcium: 9 mg/dL (ref 8.9–10.3)
Creatinine, Ser: 1.03 mg/dL (ref 0.61–1.24)
GFR calc Af Amer: >60 mL/min (ref >60)
GLUCOSE: 95 mg/dL (ref 65–99)
POTASSIUM: 4.2 mmol/L (ref 3.5–5.1)
Sodium: 140 mmol/L (ref 135–145)

## 2016-11-07 LAB — TYPE AND SCREEN
ABO/RH(D): O POS
Antibody Screen: NEGATIVE

## 2016-11-07 LAB — ABO/RH: ABO/RH(D): O POS

## 2016-11-07 LAB — SURGICAL PCR SCREEN
MRSA, PCR: NEGATIVE
STAPHYLOCOCCUS AUREUS: POSITIVE — AB

## 2016-11-07 MED ORDER — CHLORHEXIDINE GLUCONATE CLOTH 2 % EX PADS: 6.0000 | MEDICATED_PAD | Freq: Once | CUTANEOUS | Status: DC

## 2016-11-07 NOTE — Progress Notes (Signed)
PCP - none, pt goes to [facility] in [place] but reports no PCP or health history   Patient denies shortness of breath, fever, cough and chest pain at PAT appointment   Patient verbalized understanding of instructions that were given to them at the PAT appointment. Patient was also instructed that they will need to review over the PAT instructions again at home before surgery.

## 2016-11-08 MED ORDER — DEXTROSE 5 % IV SOLN
3.0000 g | INTRAVENOUS | Status: AC
  Administered 2016-11-09 (×2): 3 g via INTRAVENOUS
  Filled 2016-11-08: qty 3000

## 2016-11-09 ENCOUNTER — Inpatient Hospital Stay (HOSPITAL_COMMUNITY)
Admission: RE | Admit: 2016-11-09 | Discharge: 2016-11-12 | DRG: 460 | Disposition: A | Discharge status: Home / Self Care | Source: Ambulatory Visit | Attending: Neurosurgery | Admitting: Neurosurgery

## 2016-11-09 ENCOUNTER — Encounter (HOSPITAL_COMMUNITY): Payer: Self-pay | Admitting: *Deleted

## 2016-11-09 ENCOUNTER — Inpatient Hospital Stay (HOSPITAL_COMMUNITY): Admitting: Certified Registered"

## 2016-11-09 ENCOUNTER — Encounter (HOSPITAL_COMMUNITY)
Admission: RE | Disposition: A | Discharge status: Home / Self Care | Payer: Self-pay | Source: Ambulatory Visit | Attending: Neurosurgery

## 2016-11-09 ENCOUNTER — Inpatient Hospital Stay (HOSPITAL_COMMUNITY)

## 2016-11-09 DIAGNOSIS — I252 Old myocardial infarction: Secondary | ICD-10-CM

## 2016-11-09 DIAGNOSIS — E119 Type 2 diabetes mellitus without complications: Secondary | ICD-10-CM | POA: Diagnosis present

## 2016-11-09 DIAGNOSIS — I10 Essential (primary) hypertension: Secondary | ICD-10-CM | POA: Diagnosis present

## 2016-11-09 DIAGNOSIS — M5126 Other intervertebral disc displacement, lumbar region: Secondary | ICD-10-CM | POA: Diagnosis present

## 2016-11-09 DIAGNOSIS — M4316 Spondylolisthesis, lumbar region: Secondary | ICD-10-CM | POA: Diagnosis present

## 2016-11-09 DIAGNOSIS — Z419 Encounter for procedure for purposes other than remedying health state, unspecified: Secondary | ICD-10-CM

## 2016-11-09 DIAGNOSIS — M48062 Spinal stenosis, lumbar region with neurogenic claudication: Secondary | ICD-10-CM | POA: Diagnosis present

## 2016-11-09 SURGERY — POSTERIOR LUMBAR FUSION 2 LEVEL
Anesthesia: General

## 2016-11-09 MED ORDER — FENTANYL CITRATE (PF) 250 MCG/5ML IJ SOLN
INTRAMUSCULAR | Status: AC
  Filled 2016-11-09: qty 5

## 2016-11-09 MED ORDER — DIAZEPAM 5 MG PO TABS
5.0000 mg | ORAL_TABLET | Freq: Four times a day (QID) | ORAL | Status: DC | PRN
  Administered 2016-11-11: 5 mg via ORAL
  Filled 2016-11-09: qty 1

## 2016-11-09 MED ORDER — MORPHINE SULFATE (PF) 2 MG/ML IV SOLN: 2.0000 mg | INTRAVENOUS | Status: DC | PRN

## 2016-11-09 MED ORDER — LIDOCAINE 2% (20 MG/ML) 5 ML SYRINGE
INTRAMUSCULAR | Status: AC
  Filled 2016-11-09: qty 5

## 2016-11-09 MED ORDER — ONDANSETRON HCL 4 MG/2ML IJ SOLN: 4.0000 mg | Freq: Four times a day (QID) | INTRAMUSCULAR | Status: DC | PRN

## 2016-11-09 MED ORDER — MAGNESIUM CITRATE PO SOLN: 1.0000 | Freq: Once | ORAL | Status: DC | PRN

## 2016-11-09 MED ORDER — ARTIFICIAL TEARS OPHTHALMIC OINT
TOPICAL_OINTMENT | OPHTHALMIC | Status: AC
  Filled 2016-11-09: qty 3.5

## 2016-11-09 MED ORDER — ONDANSETRON HCL 4 MG/2ML IJ SOLN
INTRAMUSCULAR | Status: DC | PRN
  Administered 2016-11-09: 4 mg via INTRAVENOUS

## 2016-11-09 MED ORDER — SODIUM CHLORIDE 0.9% FLUSH: 3.0000 mL | INTRAVENOUS | Status: DC | PRN

## 2016-11-09 MED ORDER — LACTATED RINGERS IV SOLN
INTRAVENOUS | Status: DC | PRN
  Administered 2016-11-09 (×3): via INTRAVENOUS

## 2016-11-09 MED ORDER — SENNA 8.6 MG PO TABS
1.0000 | ORAL_TABLET | Freq: Two times a day (BID) | ORAL | Status: DC
  Administered 2016-11-09 – 2016-11-12 (×6): 8.6 mg via ORAL
  Filled 2016-11-09 (×6): qty 1

## 2016-11-09 MED ORDER — SUGAMMADEX SODIUM 200 MG/2ML IV SOLN
INTRAVENOUS | Status: DC | PRN
  Administered 2016-11-09: 300 mg via INTRAVENOUS

## 2016-11-09 MED ORDER — OXYCODONE HCL 5 MG PO TABS
5.0000 mg | ORAL_TABLET | ORAL | Status: DC | PRN
  Administered 2016-11-09: 5 mg via ORAL
  Administered 2016-11-10 (×2): 10 mg via ORAL
  Administered 2016-11-11: 5 mg via ORAL
  Administered 2016-11-11 – 2016-11-12 (×2): 10 mg via ORAL
  Administered 2016-11-12 (×2): 5 mg via ORAL
  Filled 2016-11-09: qty 2
  Filled 2016-11-09: qty 1
  Filled 2016-11-09 (×3): qty 2
  Filled 2016-11-09 (×3): qty 1

## 2016-11-09 MED ORDER — ONDANSETRON HCL 4 MG/2ML IJ SOLN
INTRAMUSCULAR | Status: AC
  Filled 2016-11-09: qty 2

## 2016-11-09 MED ORDER — ACETAMINOPHEN 650 MG RE SUPP: 650.0000 mg | RECTAL | Status: DC | PRN

## 2016-11-09 MED ORDER — DEXAMETHASONE SODIUM PHOSPHATE 10 MG/ML IJ SOLN
INTRAMUSCULAR | Status: DC | PRN
  Administered 2016-11-09: 10 mg via INTRAVENOUS

## 2016-11-09 MED ORDER — ALBUMIN HUMAN 5 % IV SOLN
INTRAVENOUS | Status: DC | PRN
  Administered 2016-11-09: 12:00:00 via INTRAVENOUS

## 2016-11-09 MED ORDER — MIDAZOLAM HCL 2 MG/2ML IJ SOLN
INTRAMUSCULAR | Status: AC
  Filled 2016-11-09: qty 2

## 2016-11-09 MED ORDER — DEXAMETHASONE SODIUM PHOSPHATE 10 MG/ML IJ SOLN
INTRAMUSCULAR | Status: AC
  Filled 2016-11-09: qty 1

## 2016-11-09 MED ORDER — ACETAMINOPHEN 325 MG PO TABS: 650.0000 mg | ORAL_TABLET | ORAL | Status: DC | PRN

## 2016-11-09 MED ORDER — PROPOFOL 10 MG/ML IV BOLUS
INTRAVENOUS | Status: AC
  Filled 2016-11-09: qty 20

## 2016-11-09 MED ORDER — SODIUM CHLORIDE 0.9 % IV SOLN: 250.0000 mL | INTRAVENOUS | Status: DC

## 2016-11-09 MED ORDER — ROCURONIUM BROMIDE 10 MG/ML (PF) SYRINGE
PREFILLED_SYRINGE | INTRAVENOUS | Status: DC | PRN
  Administered 2016-11-09: 20 mg via INTRAVENOUS
  Administered 2016-11-09: 10 mg via INTRAVENOUS
  Administered 2016-11-09: 20 mg via INTRAVENOUS
  Administered 2016-11-09 (×3): 10 mg via INTRAVENOUS
  Administered 2016-11-09: 20 mg via INTRAVENOUS
  Administered 2016-11-09: 60 mg via INTRAVENOUS
  Administered 2016-11-09 (×2): 10 mg via INTRAVENOUS

## 2016-11-09 MED ORDER — PHENYLEPHRINE 40 MCG/ML (10ML) SYRINGE FOR IV PUSH (FOR BLOOD PRESSURE SUPPORT)
PREFILLED_SYRINGE | INTRAVENOUS | Status: AC
  Filled 2016-11-09: qty 10

## 2016-11-09 MED ORDER — ROCURONIUM BROMIDE 10 MG/ML (PF) SYRINGE
PREFILLED_SYRINGE | INTRAVENOUS | Status: AC
  Filled 2016-11-09: qty 5

## 2016-11-09 MED ORDER — ONDANSETRON HCL 4 MG PO TABS: 4.0000 mg | ORAL_TABLET | Freq: Four times a day (QID) | ORAL | Status: DC | PRN

## 2016-11-09 MED ORDER — CEFAZOLIN SODIUM 1 G IJ SOLR
INTRAMUSCULAR | Status: AC
  Filled 2016-11-09: qty 30

## 2016-11-09 MED ORDER — SODIUM CHLORIDE 0.9% FLUSH
3.0000 mL | Freq: Two times a day (BID) | INTRAVENOUS | Status: DC
  Administered 2016-11-10 – 2016-11-11 (×4): 3 mL via INTRAVENOUS

## 2016-11-09 MED ORDER — LIDOCAINE 2% (20 MG/ML) 5 ML SYRINGE
INTRAMUSCULAR | Status: DC | PRN
  Administered 2016-11-09: 100 mg via INTRAVENOUS

## 2016-11-09 MED ORDER — FENTANYL CITRATE (PF) 250 MCG/5ML IJ SOLN
INTRAMUSCULAR | Status: DC | PRN
  Administered 2016-11-09 (×2): 50 ug via INTRAVENOUS
  Administered 2016-11-09: 150 ug via INTRAVENOUS
  Administered 2016-11-09 (×2): 50 ug via INTRAVENOUS
  Administered 2016-11-09: 100 ug via INTRAVENOUS

## 2016-11-09 MED ORDER — PHENOL 1.4 % MT LIQD: 1.0000 | OROMUCOSAL | Status: DC | PRN

## 2016-11-09 MED ORDER — SUGAMMADEX SODIUM 200 MG/2ML IV SOLN
INTRAVENOUS | Status: AC
  Filled 2016-11-09: qty 2

## 2016-11-09 MED ORDER — PROMETHAZINE HCL 25 MG/ML IJ SOLN: 6.2500 mg | INTRAMUSCULAR | Status: DC | PRN

## 2016-11-09 MED ORDER — MENTHOL 3 MG MT LOZG: 1.0000 | LOZENGE | OROMUCOSAL | Status: DC | PRN

## 2016-11-09 MED ORDER — LACTATED RINGERS IV SOLN
INTRAVENOUS | Status: DC
  Administered 2016-11-09: 09:00:00 via INTRAVENOUS

## 2016-11-09 MED ORDER — MIDAZOLAM HCL 2 MG/2ML IJ SOLN
INTRAMUSCULAR | Status: DC | PRN
  Administered 2016-11-09: 2 mg via INTRAVENOUS

## 2016-11-09 MED ORDER — ARTIFICIAL TEARS OPHTHALMIC OINT
TOPICAL_OINTMENT | OPHTHALMIC | Status: DC | PRN
  Administered 2016-11-09: 1 via OPHTHALMIC

## 2016-11-09 MED ORDER — OXYCODONE HCL ER 10 MG PO T12A
10.0000 mg | EXTENDED_RELEASE_TABLET | Freq: Two times a day (BID) | ORAL | Status: DC
  Administered 2016-11-09 – 2016-11-12 (×6): 10 mg via ORAL
  Filled 2016-11-09 (×6): qty 1

## 2016-11-09 MED ORDER — SENNOSIDES-DOCUSATE SODIUM 8.6-50 MG PO TABS: 1.0000 | ORAL_TABLET | Freq: Every evening | ORAL | Status: DC | PRN

## 2016-11-09 MED ORDER — BISACODYL 5 MG PO TBEC: 5.0000 mg | DELAYED_RELEASE_TABLET | Freq: Every day | ORAL | Status: DC | PRN

## 2016-11-09 MED ORDER — PROPOFOL 10 MG/ML IV BOLUS
INTRAVENOUS | Status: DC | PRN
  Administered 2016-11-09: 200 mg via INTRAVENOUS

## 2016-11-09 MED ORDER — LIDOCAINE-EPINEPHRINE 0.5 %-1:200000 IJ SOLN
INTRAMUSCULAR | Status: AC
  Filled 2016-11-09: qty 1

## 2016-11-09 MED ORDER — THROMBIN 20000 UNITS EX SOLR
CUTANEOUS | Status: AC
  Filled 2016-11-09: qty 20000

## 2016-11-09 MED ORDER — MEPERIDINE HCL 25 MG/ML IJ SOLN: 6.2500 mg | INTRAMUSCULAR | Status: DC | PRN

## 2016-11-09 MED ORDER — HYDROMORPHONE HCL 1 MG/ML IJ SOLN: 0.2500 mg | INTRAMUSCULAR | Status: DC | PRN

## 2016-11-09 MED ORDER — POTASSIUM CHLORIDE IN NACL 20-0.9 MEQ/L-% IV SOLN
INTRAVENOUS | Status: DC
  Filled 2016-11-09: qty 1000

## 2016-11-09 MED ORDER — KETOROLAC TROMETHAMINE 30 MG/ML IJ SOLN: 30.0000 mg | Freq: Once | INTRAMUSCULAR | Status: DC | PRN

## 2016-11-09 MED ORDER — PHENYLEPHRINE 40 MCG/ML (10ML) SYRINGE FOR IV PUSH (FOR BLOOD PRESSURE SUPPORT)
PREFILLED_SYRINGE | INTRAVENOUS | Status: DC | PRN
  Administered 2016-11-09 (×5): 80 ug via INTRAVENOUS

## 2016-11-09 MED ORDER — THROMBIN 20000 UNITS EX SOLR
CUTANEOUS | Status: DC | PRN
  Administered 2016-11-09: 12:00:00 via TOPICAL

## 2016-11-09 MED ORDER — 0.9 % SODIUM CHLORIDE (POUR BTL) OPTIME
TOPICAL | Status: DC | PRN
  Administered 2016-11-09: 1000 mL

## 2016-11-09 MED ORDER — LIDOCAINE-EPINEPHRINE 0.5 %-1:200000 IJ SOLN
INTRAMUSCULAR | Status: DC | PRN
  Administered 2016-11-09: 20 mL
  Administered 2016-11-09: 30 mL

## 2016-11-09 MED ORDER — CELECOXIB 200 MG PO CAPS
200.0000 mg | ORAL_CAPSULE | Freq: Two times a day (BID) | ORAL | Status: DC
  Administered 2016-11-09 – 2016-11-12 (×6): 200 mg via ORAL
  Filled 2016-11-09 (×6): qty 1

## 2016-11-09 MED ORDER — ZOLPIDEM TARTRATE 5 MG PO TABS: 5.0000 mg | ORAL_TABLET | Freq: Every evening | ORAL | Status: DC | PRN

## 2016-11-09 SURGICAL SUPPLY — 66 items
BAG DECANTER FOR FLEXI CONT (MISCELLANEOUS) ×2 IMPLANT
BENZOIN TINCTURE PRP APPL 2/3 (GAUZE/BANDAGES/DRESSINGS) IMPLANT
BLADE CLIPPER SURG (BLADE) ×2 IMPLANT
BONE VIVIGEN FORMABLE 1.3CC (Bone Implant) ×2 IMPLANT
BUR MATCHSTICK NEURO 3.0 LAGG (BURR) ×2 IMPLANT
BUR PRECISION FLUTE 5.0 (BURR) ×2 IMPLANT
CAGE CONCORDE LIFT 9X21 (Cage) ×4 IMPLANT
CANISTER SUCT 3000ML PPV (MISCELLANEOUS) ×2 IMPLANT
CARTRIDGE OIL MAESTRO DRILL (MISCELLANEOUS) ×1 IMPLANT
CONT SPEC 4OZ CLIKSEAL STRL BL (MISCELLANEOUS) ×2 IMPLANT
COVER BACK TABLE 60X90IN (DRAPES) ×2 IMPLANT
DECANTER SPIKE VIAL GLASS SM (MISCELLANEOUS) ×2 IMPLANT
DERMABOND ADVANCED (GAUZE/BANDAGES/DRESSINGS) ×2
DERMABOND ADVANCED .7 DNX12 (GAUZE/BANDAGES/DRESSINGS) ×2 IMPLANT
DIFFUSER DRILL AIR PNEUMATIC (MISCELLANEOUS) ×2 IMPLANT
DRAPE C-ARM 42X72 X-RAY (DRAPES) ×2 IMPLANT
DRAPE C-ARMOR (DRAPES) ×2 IMPLANT
DRAPE LAPAROTOMY 100X72X124 (DRAPES) ×2 IMPLANT
DRAPE POUCH INSTRU U-SHP 10X18 (DRAPES) ×2 IMPLANT
DRAPE SURG 17X23 STRL (DRAPES) ×2 IMPLANT
DRSG OPSITE POSTOP 4X10 (GAUZE/BANDAGES/DRESSINGS) ×2 IMPLANT
DURAPREP 26ML APPLICATOR (WOUND CARE) ×2 IMPLANT
ELECT BLADE 4.0 EZ CLEAN MEGAD (MISCELLANEOUS) ×2
ELECT REM PT RETURN 9FT ADLT (ELECTROSURGICAL) ×2
ELECTRODE BLDE 4.0 EZ CLN MEGD (MISCELLANEOUS) ×1 IMPLANT
ELECTRODE REM PT RTRN 9FT ADLT (ELECTROSURGICAL) ×1 IMPLANT
GAUZE SPONGE 4X4 12PLY STRL (GAUZE/BANDAGES/DRESSINGS) IMPLANT
GAUZE SPONGE 4X4 16PLY XRAY LF (GAUZE/BANDAGES/DRESSINGS) ×2 IMPLANT
GLOVE ECLIPSE 6.5 STRL STRAW (GLOVE) ×4 IMPLANT
GLOVE EXAM NITRILE LRG STRL (GLOVE) IMPLANT
GLOVE EXAM NITRILE XL STR (GLOVE) IMPLANT
GLOVE EXAM NITRILE XS STR PU (GLOVE) IMPLANT
GOWN STRL REUS W/ TWL LRG LVL3 (GOWN DISPOSABLE) ×2 IMPLANT
GOWN STRL REUS W/ TWL XL LVL3 (GOWN DISPOSABLE) IMPLANT
GOWN STRL REUS W/TWL 2XL LVL3 (GOWN DISPOSABLE) IMPLANT
GOWN STRL REUS W/TWL LRG LVL3 (GOWN DISPOSABLE) ×2
GOWN STRL REUS W/TWL XL LVL3 (GOWN DISPOSABLE)
KIT BASIN OR (CUSTOM PROCEDURE TRAY) ×2 IMPLANT
KIT POSITION SURG JACKSON T1 (MISCELLANEOUS) ×2 IMPLANT
KIT ROOM TURNOVER OR (KITS) ×2 IMPLANT
MILL MEDIUM DISP (BLADE) ×2 IMPLANT
NEEDLE HYPO 25X1 1.5 SAFETY (NEEDLE) ×2 IMPLANT
NEEDLE SPNL 18GX3.5 QUINCKE PK (NEEDLE) ×2 IMPLANT
NS IRRIG 1000ML POUR BTL (IV SOLUTION) ×4 IMPLANT
OIL CARTRIDGE MAESTRO DRILL (MISCELLANEOUS) ×2
PACK LAMINECTOMY NEURO (CUSTOM PROCEDURE TRAY) ×2 IMPLANT
PAD ARMBOARD 7.5X6 YLW CONV (MISCELLANEOUS) ×6 IMPLANT
ROD RELINE COCR LORD 5.0X25 (Rod) ×2 IMPLANT
ROD RELINE COCR LORD 5X30 (Rod) ×2 IMPLANT
SCREW LOCK RSS 4.5/5.0MM (Screw) ×8 IMPLANT
SCREW RELINE RMM 5.5X35 4S (Screw) ×4 IMPLANT
SCREW RELINE RMM 6.5X40 4S (Screw) ×4 IMPLANT
SPONGE LAP 4X18 X RAY DECT (DISPOSABLE) IMPLANT
SPONGE SURGIFOAM ABS GEL 100 (HEMOSTASIS) ×2 IMPLANT
STRIP CLOSURE SKIN 1/2X4 (GAUZE/BANDAGES/DRESSINGS) IMPLANT
SUT PROLENE 6 0 BV (SUTURE) IMPLANT
SUT VIC AB 0 CT1 18XCR BRD8 (SUTURE) ×2 IMPLANT
SUT VIC AB 0 CT1 8-18 (SUTURE) ×2
SUT VIC AB 2-0 CT1 18 (SUTURE) ×4 IMPLANT
SUT VIC AB 3-0 SH 8-18 (SUTURE) ×8 IMPLANT
SYR BULB 3OZ (MISCELLANEOUS) ×2 IMPLANT
SYR CONTROL 10ML LL (SYRINGE) ×2 IMPLANT
TOWEL GREEN STERILE (TOWEL DISPOSABLE) ×2 IMPLANT
TOWEL GREEN STERILE FF (TOWEL DISPOSABLE) ×2 IMPLANT
TRAY FOLEY W/METER SILVER 16FR (SET/KITS/TRAYS/PACK) ×2 IMPLANT
WATER STERILE IRR 1000ML POUR (IV SOLUTION) ×2 IMPLANT

## 2016-11-09 NOTE — Anesthesia Postprocedure Evaluation (Signed)
Anesthesia Post Note  Patient: Tunis Passow  Procedure(s) Performed: Procedure(s) (LRB): POSTERIOR LUMBAR INTERBODY FUSION LUMBAR 3- LUMBAR 4, LUMBAR 4- LUMBAR 5, LUMBAR 2- LUMBAR 3 DISCECTOMY LEFT (N/A)     Patient location during evaluation: PACU Anesthesia Type: General Level of consciousness: sedated Pain management: pain level controlled Vital Signs Assessment: post-procedure vital signs reviewed and stable Respiratory status: spontaneous breathing and respiratory function stable Cardiovascular status: stable Anesthetic complications: no    Last Vitals:  Vitals:   11/09/16 1620 11/09/16 1630  BP: 117/78   Pulse: 76 80  Resp: (!) 25 (!) 23  Temp:    SpO2: 97% 97%    Last Pain:  Vitals:   11/09/16 1630  PainSc: Asleep                 Yacine Garriga DANIEL

## 2016-11-09 NOTE — Progress Notes (Signed)
Patient arrived to unit. A&O X4, reports pain of 5 on a 0-10scale. Wife and daughter at his bedside. Patient is requesting food. Food ordered-will continue to monitor.

## 2016-11-09 NOTE — Anesthesia Preprocedure Evaluation (Addendum)
Anesthesia Evaluation  Patient identified by MRN, date of birth, ID band Patient awake    Reviewed: Allergy & Precautions, H&P , NPO status , Patient's Chart, lab work & pertinent test results  Airway Mallampati: II  TM Distance: >3 FB Neck ROM: Full    Dental no notable dental hx. (+) Teeth Intact, Dental Advisory Given   Pulmonary neg pulmonary ROS,    Pulmonary exam normal breath sounds clear to auscultation       Cardiovascular negative cardio ROS Normal cardiovascular exam Rhythm:Regular Rate:Normal     Neuro/Psych negative neurological ROS  negative psych ROS   GI/Hepatic negative GI ROS, Neg liver ROS,   Endo/Other  negative endocrine ROS  Renal/GU negative Renal ROS  negative genitourinary   Musculoskeletal   Abdominal (+) + obese,   Peds  Hematology negative hematology ROS (+)   Anesthesia Other Findings   Reproductive/Obstetrics negative OB ROS                            Anesthesia Physical  Anesthesia Plan  ASA: II  Anesthesia Plan: General   Post-op Pain Management:    Induction: Intravenous  PONV Risk Score and Plan: 3 and Ondansetron, Dexamethasone, Midazolam, Propofol infusion and Treatment may vary due to age or medical condition  Airway Management Planned: Oral ETT  Additional Equipment:   Intra-op Plan:   Post-operative Plan: Extubation in OR  Informed Consent: I have reviewed the patients History and Physical, chart, labs and discussed the procedure including the risks, benefits and alternatives for the proposed anesthesia with the patient or authorized representative who has indicated his/her understanding and acceptance.   Dental advisory given  Plan Discussed with: CRNA and Surgeon  Anesthesia Plan Comments:         Anesthesia Quick Evaluation

## 2016-11-09 NOTE — Op Note (Signed)
11/09/2016  4:36 PM  PATIENT:  Patrick Hill  61 y.o. 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 male whom was taken to the operating room intubated, and placed under a general anesthetic without difficulty. A foley catheter was placed under sterile conditions. He was positioned prone on a Jackson stable with all pressure points properly padded.  His lumbar region was prepped and draped in a sterile manner. I infiltrated 30cc's 1/2%lidocaine/1:2000,000 strength epinephrine into the planned incision. I opened the skin with a 10 blade and took the incision down to the thoracolumbar fascia. I exposed the lamina of L2,3,4,and 5 in a subperiosteal fashion bilaterally. I confirmed my location with an intraoperative xray.  I placed self retaining retractors and started the decompression.  I decompressed the spinal canal at L2/3 on the left with a semihemilaminectomy on the left side. The MRI of the lumbar spine performed in March, 2018 showed a free disc fragment which had migrated caudally behind L3. I explored the disc, which was calcified and the spinal canal where I expected the fragment to be. I did not find a fragment nor disc material. I used dissectors to palpate around the  nerve root, L3, and could not express any disc material. I performed a foraminotomy at L2/3 on the left, and the root was well decompressed in the lateral recess. PLIF's were performed at L4/5 on each side in the same fashion. I opened the disc space with a 15 blade then used a variety of instruments to remove the disc and prepare the space for the arthrodesis. I used curettes, rongeurs, punches, shavers for the disc space, and rasps in the discetomy. I measured the disc space and placed adjustable synthes  Peek cages(Synthes) into the disc space(s). I packed morsels of allograft and autograft  I  placed pedicle screws at L4,and L5, using fluoroscopic guidance. I drilled a pilot hole, then cannulated the pedicle with a drill  at each site. I then tapped each pedicle, assessing each site for pedicle violations. No cutouts were appreciated. Screws (Nuvasive mas plif) were then placed at each site without difficulty. I attached rods and locking caps with the appropriate tools. The locking caps were secured with torque limited screwdrivers. Final films were performed and the final construct appeared to be in good position.  I closed the wound in a layered fashion. I approximated the thoracolumbar fascia, subcutaneous, and subcuticular planes with vicryl sutures. I used dermabond for a sterile dressing.     PLAN OF CARE: Admit to inpatient   PATIENT DISPOSITION:  PACU - hemodynamically stable.   Delay start of Pharmacological VTE agent (>24hrs) due to surgical blood loss or risk of bleeding:  yes

## 2016-11-09 NOTE — Transfer of Care (Signed)
Immediate Anesthesia Transfer of Care Note  Patient: Patrick Hill  Procedure(s) Performed: Procedure(s) with comments: POSTERIOR LUMBAR INTERBODY FUSION LUMBAR 3- LUMBAR 4, LUMBAR 4- LUMBAR 5, LUMBAR 2- LUMBAR 3 DISCECTOMY LEFT (N/A) - POSTERIOR LUMBAR INTERBODY FUSION LUMBAR FOUR- LUMBAR FIVE, NONSEGMENTALPEDICLE SCREW  FIXATION LUMBAR FOUR -LUMBAR FIVE, LUMBAR TWO- LUMBAR THREE  LEFT LAMINECTOMY AND FORAMINOTOMY  Patient Location: PACU  Anesthesia Type:General  Level of Consciousness: awake, alert  and oriented  Airway & Oxygen Therapy: Patient Spontanous Breathing and Patient connected to face mask oxygen  Post-op Assessment: Report given to RN and Post -op Vital signs reviewed and stable  Post vital signs: Reviewed and stable  Last Vitals:  Vitals:   11/09/16 0819  BP: 140/82  Pulse: 63  Resp: 20  Temp: 36.8 C  SpO2: 99%    Last Pain:  Vitals:   11/09/16 0902  PainSc: 9       Patients Stated Pain Goal: 3 (11/09/16 0902)  Complications: No apparent anesthesia complications

## 2016-11-09 NOTE — Progress Notes (Signed)
Incentive spirometer given and educated, patient achieved 2000 on IS. Will continue to monitor

## 2016-11-09 NOTE — H&P (Signed)
BP 140/82   Pulse 63   Temp 98.3 F (36.8 C)   Resp 20   Wt 122.9 kg (271 lb)   SpO2 99%   BMI 36.75 kg/m  Mr. Patrick Hill comes in today for evaluation of pain that he has in his lower back. Mr. Patrick Hill has had problems since at the very least 2016. He was seen and treated by Dr. Yevette Edwards since that time. Says he has had injections with some relief, but he has had no lasting relief. He is seeing me truly for a second opinion. He says he hurts more now in his lower back pain than he did in 2016. He has difficulty walking secondary to pain. He says if he stands for more than 15-20 minutes or walks for that time, he is in a great deal of pain. Looking at Dr. Marshell Levan notes, initially, he did not have a disc herniation at 2-3 on the left side, but at this point, he does. Dr. Yevette Edwards felt that the most recent disc herniation was probably responsible for pain that he has in the anterior thigh, which is above the knee on the left side, and he certainly believed that the stenosis at 4-5. He certainly is stenotic at 3-4, though not nearly as bad as he is at 4-5. He has facet hypertrophy at both levels. He has a mild retrolisthesis at 3-4. He has a mild anterolisthesis at 4-5.  Mother and father, no information was provided. PAST MEDICAL HISTORY: Includes hypertension and myocardial infarction, lung disease, diabetes, gastrointestinal problems and cancer. He is 61 years of age. He is right handed. Does not smoke. Does drink socially. No history of substance abuse. REVIEW OF SYSTEMS: Positive for back and lower extremity pain. He has no bowel or bladder dysfunction. OBJECTIVE: Vital Signs: Weight 255 pounds. Temperature is 98.2, blood pressure is 129/79, pulse is 71, pain is 7/10.  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 He continues to work and was not planning on retiring at this  point. PHYSICAL EXAMINATION: Neurologic: He is alert, oriented by 4, and he is answering all questions appropriately. Memory, language, attention span, and fund of knowledge are normal. Speech is clear. It is also fluent. He has symmetric facial sensation and symmetric facial movement. Uvula elevates in midline. Shoulder shrug is normal. Tongue protrudes in the midline. He did state after the injection at L2-3 that he did have significant improvement. Dr. Yevette Edwards took great pains to go over possible surgery with him for the L2-3 disc. Because at that point, he felt that Mr. Patrick Hill was being very vague about his symptoms. He was not getting a clear-cut case of neurogenic claudication. That is slightly different today when I am speaking to Mr. Patrick Hill, but he certainly does state that the back pain is what is the most pressing issue that he has. However, he does clearly state standing and walking cause him a severe amount of pain, and given the severe amount of stenosis present at 4-5, I think the stenosis probably is contributing a great deal to his discomfort. The disc at 2-3 is quite large, and he does complain about it. The L3-4 stenosis is certainly more moderate than mild, and the disc at 2-3 is a large free fragment that has extended down on the left side. Patrick Hill presents with slight difficult problem. 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 It would take time. He certainly could get back to it, but it would be on the order of 6 to 8 months. If he just had the decompression, certainly, it would be sooner, but his presentation to me today was far more pressing with regard to the back and lower extremity pain when he stands or walks, which I do not think would be dealt with by just doing the discectomy, and I think given his problems that he would need both L3-4 and 4-5  decompressed and subsequent arthrodesis and a discectomy done at L2-3. The reason being the retrolisthesis and anterolisthesis, the stenosis and the fact that he has not gotten significant improvement since undergoing treatment, which has been quite good and thorough in a conservative fashion since 2016. Risks and benefits, bleeding, infection, no relief, need for further surgery, fusion failure, hardware failure, no pain relief, damage to the nerve roots, bowel or bladder dysfunction were discussed. We will have to await approval.

## 2016-11-09 NOTE — Anesthesia Procedure Notes (Signed)
Procedure Name: Intubation Date/Time: 11/09/2016 10:42 AM Performed by: Teressa Lower Pre-anesthesia Checklist: Patient identified, Emergency Drugs available, Suction available and Patient being monitored Patient Re-evaluated:Patient Re-evaluated prior to induction Oxygen Delivery Method: Circle system utilized Preoxygenation: Pre-oxygenation with 100% oxygen Induction Type: IV induction Ventilation: Mask ventilation without difficulty Laryngoscope Size: Mac and 4 Grade View: Grade II Tube type: Oral Tube size: 7.5 mm Number of attempts: 1 Airway Equipment and Method: Stylet and Oral airway Placement Confirmation: ETT inserted through vocal cords under direct vision,  positive ETCO2 and breath sounds checked- equal and bilateral Secured at: 23 cm Tube secured with: Tape Dental Injury: Teeth and Oropharynx as per pre-operative assessment

## 2016-11-10 ENCOUNTER — Encounter (HOSPITAL_COMMUNITY): Payer: Self-pay

## 2016-11-10 MED FILL — Sodium Chloride IV Soln 0.9%: INTRAVENOUS | Qty: 1000 | Status: AC

## 2016-11-10 MED FILL — Heparin Sodium (Porcine) Inj 1000 Unit/ML: INTRAMUSCULAR | Qty: 30 | Status: AC

## 2016-11-10 NOTE — Progress Notes (Signed)
Patient appeared to rest during the night.  Patient advised that does have mild back pain however declined medication therapy.  RN pulled patients foley - at that time patient ambulated to the restroom and back to the bed.  Patient ambulated with walker & had a steady gait.

## 2016-11-10 NOTE — Evaluation (Signed)
Physical Therapy Evaluation Patient Details Name: Patrick Hill MRN: [id number] DOB: 07-14-1955 Today's Date: 11/10/2016   History of Present Illness  Pt is a 61 y/o male s/p PLIF L4-5. PMH including but not limited to HTN, DM and hx of MI.  Clinical Impression  Pt presented supine in bed with HOB elevated, awake and willing to participate in therapy session. Prior to admission, pt reported he was independent with all functional mobility and ADLs. Pt will have 24/7 supervision/assist from family upon d/c home. Pt ambulated in room with use of RW and min guard for safety. PT reviewed 3/3 back precautions with pt throughout session. PT will continue to follow pt acutely for mobility progression and to ensure a safe d/c home.     Follow Up Recommendations No PT follow up;Supervision/Assistance - 24 hour    Equipment Recommendations  Rolling walker with 5" wheels;Other (comment) (pt currently refusing)    Recommendations for Other Services       Precautions / Restrictions Precautions Precautions: Fall;Back Precaution Comments: PT reviewed 3/3 back precautions with pt and log roll technique Required Braces or Orthoses: Spinal Brace Spinal Brace: Lumbar corset Restrictions Weight Bearing Restrictions: No      Mobility  Bed Mobility Overal bed mobility: Needs Assistance Bed Mobility: Rolling;Sidelying to Sit Rolling: Supervision Sidelying to sit: Supervision       General bed mobility comments: increased time and effort, use of bed rails, cueing for log roll technique  Transfers Overall transfer level: Needs assistance Equipment used: Rolling walker (2 wheeled) Transfers: Sit to/from Stand Sit to Stand: Min guard         General transfer comment: increased time and effort, good technique, min guard for safety  Ambulation/Gait Ambulation/Gait assistance: Min guard              Stairs            Wheelchair Mobility    Modified Rankin (Stroke Patients  Only)       Balance Overall balance assessment: Needs assistance Sitting-balance support: Feet supported Sitting balance-Leahy Scale: Good Sitting balance - Comments: min A to don lumbar corset sitting EOB   Standing balance support: During functional activity;Bilateral upper extremity supported Standing balance-Leahy Scale: Poor Standing balance comment: pt reliant on bilateral UEs on RW                             Pertinent Vitals/Pain Pain Assessment: Faces Faces Pain Scale: Hurts little more Pain Location: back Pain Descriptors / Indicators: Sore;Discomfort Pain Intervention(s): Monitored during session;Repositioned    Home Living Family/patient expects to be discharged to:: Private residence Living Arrangements: Spouse/significant other;Children Available Help at Discharge: Family;Available 24 hours/day Type of Home: House Home Access: Stairs to enter   [employer] of Steps: 2 Home Layout: One level Home Equipment: Shower seat      Prior Function Level of Independence: Independent               Hand Dominance        Extremity/Trunk Assessment   Upper Extremity Assessment Upper Extremity Assessment: Defer to OT evaluation    Lower Extremity Assessment Lower Extremity Assessment: Overall WFL for tasks assessed    Cervical / Trunk Assessment Cervical / Trunk Assessment: Other exceptions Cervical / Trunk Exceptions: s/p lumbar sx  Communication   Communication: No difficulties  Cognition Arousal/Alertness: Awake/alert Behavior During Therapy: WFL for tasks assessed/performed Overall Cognitive Status: Within Functional Limits for tasks  assessed                                        General Comments      Exercises     Assessment/Plan    PT Assessment Patient needs continued PT services  PT Problem List Decreased balance;Decreased mobility;Decreased coordination;Decreased knowledge of use of  DME;Decreased safety awareness;Decreased knowledge of precautions;Pain       PT Treatment Interventions DME instruction;Gait training;Stair training;Functional mobility training;Therapeutic activities;Therapeutic exercise;Neuromuscular re-education;Balance training;Patient/family education    PT Goals (Current goals can be found in the Care Plan section)  Acute Rehab PT Goals Patient Stated Goal: return home, decrease pain PT Goal Formulation: With patient Time For Goal Achievement: 11/24/16 Potential to Achieve Goals: Good    Frequency Min 5X/week   Barriers to discharge        Co-evaluation               AM-PAC PT "6 Clicks" Daily Activity  Outcome Measure Difficulty turning over in bed (including adjusting bedclothes, sheets and blankets)?: A Little Difficulty moving from lying on back to sitting on the side of the bed? : A Little Difficulty sitting down on and standing up from a chair with arms (e.g., wheelchair, bedside commode, etc,.)?: Unable Help needed moving to and from a bed to chair (including a wheelchair)?: A Little Help needed walking in hospital room?: A Little Help needed climbing 3-5 steps with a railing? : A Little 6 Click Score: 16    End of Session Equipment Utilized During Treatment: Gait belt;Back brace Activity Tolerance: Patient tolerated treatment well     PT Visit Diagnosis: Other abnormalities of gait and mobility (R26.89);Pain Pain - part of body:  (back)    Time: 1320-1411 PT Time Calculation (min) (ACUTE ONLY): 51 min   Charges:   PT Evaluation $PT Eval Moderate Complexity: 1 Mod PT Treatments $Gait Training: 8-22 mins $Therapeutic Activity: 8-22 mins   PT G Codes:        ElkoJennifer Garo Heidelberg, PT, DPT [phone]   Alessandra BevelsJennifer M Mekayla Soman 11/10/2016, 3:06 PM

## 2016-11-10 NOTE — Progress Notes (Signed)
Patient received Brace. Patient walked around in the rooms with physical assist, with minimal assist. Patient currently sitting in the chair. Report 0/10 pain.

## 2016-11-10 NOTE — Progress Notes (Signed)
Pt admitted under Workers Comp for [employer]. Per patient he does not have a Case Manager or contact person. CM called and spoke to a person through [employer] Workers [employer] and obtained a fax number in which to send [facility] or [facility]. CM awaiting PT/OT recommendations and physician orders. CM inquired about patients PCP. Pt states he does not have one and he is not interested in information to obtain a PCP. [facility] any [facility] needs that arise.

## 2016-11-10 NOTE — Addendum Note (Signed)
Addendum  created 11/10/16 1501 by Leilani AbleHatchett, Koraline Phillipson, MD   Anesthesia Staff edited

## 2016-11-10 NOTE — Progress Notes (Signed)
CSW acknowledging consult for potential SNF placement.  Per PT evaluation, no follow-up needed at this time. CSW not needed for SNF placement.  CSW signing off.  Blenda NicelyElizabeth Mackensi Mahadeo, [place] Clinical Social Worker [phone]

## 2016-11-10 NOTE — Progress Notes (Signed)
OT Cancellation Note  Patient Details Name: Patrick Hill MRN: [id number] DOB: 08-29-1955   Cancelled Treatment:     UNABLE TO SEE PNT IN THE AM SECONDARY AWAITING BACK BRACE.   Amritpal Shropshire 11/10/2016, 10:09 AM

## 2016-11-10 NOTE — Progress Notes (Signed)
Patient walked with walker to restroom and back to bed  Patient tolerated well, stated had no pain at this time.

## 2016-11-10 NOTE — Progress Notes (Signed)
CM faxed the orders and required form to the workers compensation division of the [employer] for the patients walker. CM spoke to a representative and it will take 24 hours for it to be processed. CM also informed Clydie BraunKaren with [facility] of the process and the need for a walker for the patient. They are awaiting to hear from the Worker Comp to deliver the walker.

## 2016-11-10 NOTE — Progress Notes (Signed)
Patient ID: Patrick BullaParminder Hill, male   DOB: 11/14/1955, 61 y.o.   MRN: [id number] BP 121/66 (BP Location: Left Arm)   Pulse 79   Temp 98.4 F (36.9 C) (Oral)   Resp 17   Wt 122.9 kg (271 lb)   SpO2 97%   BMI 36.75 kg/m  Alert and oriented x 4, speech is clear and fluent Moving all extremities well Wound is clean, and dry no signs of infection. Continue PT

## 2016-11-11 MED ORDER — TIZANIDINE HCL 4 MG PO TABS: 4.0000 mg | ORAL_TABLET | Freq: Four times a day (QID) | ORAL | 0 refills | Status: DC | PRN

## 2016-11-11 MED ORDER — HYDROCODONE-ACETAMINOPHEN 5-325 MG PO TABS: 1.0000 | ORAL_TABLET | Freq: Four times a day (QID) | ORAL | 0 refills | Status: DC | PRN

## 2016-11-11 NOTE — Discharge Instructions (Signed)

## 2016-11-11 NOTE — Evaluation (Addendum)
Occupational Therapy Evaluation Patient Details Name: Patrick Hill MRN: [id number] DOB: [birth year] Today's Date: 11/11/2016    History of Present Illness Pt is a 61 y/o male s/p PLIF L4-5. PMH including but not limited to HTN, DM and hx of MI.   Clinical Impression   This 61 y/o M presents with the above. At baseline Pt is independent with ADLs and functional mobility, was working for [employer]. Pt currently requires MinGuard assist for functional mobility using RW, MaxA for LB ADLs secondary to adhering to back precautions. Pt requires min verbal cues for adhering to back precautions during session. Pt will return home with family who are able to provide assist with ADLs PRN. Will continue to follow acutely to maximize Pt's safety and independence with ADLs and functional mobility prior to return home.     Follow Up Recommendations  No OT follow up;Supervision/Assistance - 24 hour    Equipment Recommendations  Other (comment) (Pt declining 3:1 at this time )           Precautions / Restrictions Precautions Precautions: Fall;Back Precaution Comments: Reviewed 3/3 back precautions and handout provided  Required Braces or Orthoses: Spinal Brace Spinal Brace: Lumbar corset;Applied in sitting position Restrictions Weight Bearing Restrictions: No      Mobility Bed Mobility Overal bed mobility: Needs Assistance Bed Mobility: Rolling;Sidelying to Sit;Sit to Sidelying Rolling: Supervision Sidelying to sit: Min guard     Sit to sidelying: Min assist General bed mobility comments: close guard for safety, increased time and effort to come to sitting; MinA for LE management when returning to sidelying; verbal cues for log roll technique   Transfers Overall transfer level: Needs assistance Equipment used: Rolling walker (2 wheeled) Transfers: Sit to/from Stand Sit to Stand: Min guard         General transfer comment: increased time and effort, good technique, min guard for  safety    Balance Overall balance assessment: Needs assistance Sitting-balance support: Feet supported Sitting balance-Leahy Scale: Good Sitting balance - Comments: static sitting EOB during education on AE    Standing balance support: During functional activity;Bilateral upper extremity supported Standing balance-Leahy Scale: Fair Standing balance comment: pt reliant on bilateral UEs on RW during mobility; able to maintain static standing to wash hands at sink with close guard for safety                            ADL either performed or assessed with clinical judgement   ADL Overall ADL's : Needs assistance/impaired Eating/Feeding: Set up;Sitting   Grooming: Wash/dry hands;Min guard;Standing   Upper Body Bathing: Min guard;Sitting   Lower Body Bathing: Moderate assistance;Sit to/from stand   Upper Body Dressing : Sitting;Minimal assistance Upper Body Dressing Details (indicate cue type and reason): MinA to don lumbar corset  Lower Body Dressing: Maximal assistance;Sit to/from stand   Toilet Transfer: Min guard;Ambulation;Regular Toilet;Grab bars;RW   Toileting- Architect and Hygiene: Min guard;Sit to/from Nurse, children's Details (indicate cue type and reason): educated on use of shower chair during task to increase safety and increase ease of adhering to back precautions  Functional mobility during ADLs: Min guard;Rolling walker General ADL Comments: Pt requires min verbal cues for adhering to back precautions; educated on AE (Pt plans to purchase reacher) and compensatory techniques for completing ADLs while adhering to back precautions  Pertinent Vitals/Pain Pain Assessment: 0-10 Pain Score: 5  Pain Location: back Pain Descriptors / Indicators: Sore;Discomfort Pain Intervention(s): Monitored during session;Repositioned          Extremity/Trunk Assessment Upper Extremity Assessment Upper Extremity  Assessment: Overall WFL for tasks assessed   Lower Extremity Assessment Lower Extremity Assessment: Defer to PT evaluation   Cervical / Trunk Assessment Cervical / Trunk Assessment: Other exceptions Cervical / Trunk Exceptions: s/p lumbar sx   Communication Communication Communication: No difficulties   Cognition Arousal/Alertness: Awake/alert Behavior During Therapy: WFL for tasks assessed/performed Overall Cognitive Status: Within Functional Limits for tasks assessed                                                      Home Living Family/patient expects to be discharged to:: Private residence Living Arrangements: Spouse/significant other;Children Available Help at Discharge: Family;Available 24 hours/day Type of Home: House Home Access: Stairs to enter [employer] Corporation[employer]trance Stairs-Number of Steps: 2   Home Layout: One level     Bathroom Shower/Tub: Producer, television/film/videoWalk-in shower   Bathroom Toilet: Standard     Home Equipment: Shower seat          Prior Functioning/Environment Level of Independence: Independent        Comments: works for [employer]         OT Problem List: Decreased strength;Decreased activity tolerance;Decreased knowledge of use of DME or AE;Decreased knowledge of precautions      OT Treatment/Interventions: Self-care/ADL training;DME and/or AE instruction;Therapeutic activities;Balance training;Therapeutic exercise;Energy conservation;Patient/family education    OT Goals(Current goals can be found in the care plan section) Acute Rehab OT Goals Patient Stated Goal: return home, decrease pain OT Goal Formulation: With patient Time For Goal Achievement: 11/25/16 Potential to Achieve Goals: Good ADL Goals Pt Will Perform Grooming: with modified independence;standing Pt Will Perform Upper Body Bathing: with modified independence;sitting Pt Will Perform Lower Body Bathing: with min assist;with adaptive equipment;sit to/from stand;with caregiver  independent in assisting (AE PRN ) Pt Will Perform Lower Body Dressing: with min assist;with caregiver independent in assisting;with adaptive equipment;sit to/from stand (AE PRN ) Pt Will Transfer to Toilet: with modified independence;ambulating;regular height toilet;grab bars Pt Will Perform Toileting - Clothing Manipulation and hygiene: with min guard assist;sit to/from stand Pt Will Perform Tub/Shower Transfer: Shower transfer;with min guard assist;ambulating;shower seat;rolling walker  OT Frequency: Min 2X/week                             AM-PAC PT "6 Clicks" Daily Activity     Outcome Measure Help from another person eating meals?: None Help from another person taking care of personal grooming?: A Little Help from another person toileting, which includes using toliet, bedpan, or urinal?: A Little Help from another person bathing (including washing, rinsing, drying)?: A Lot Help from another person to put on and taking off regular upper body clothing?: A Little Help from another person to put on and taking off regular lower body clothing?: A Lot 6 Click Score: 17   End of Session Equipment Utilized During Treatment: Gait belt;Rolling walker;Back brace Nurse Communication: Mobility status  Activity Tolerance: Patient tolerated treatment well Patient left: in bed;with call bell/phone within reach;Other (comment) (PT present to begin session )  OT Visit Diagnosis: Unsteadiness on feet (R26.81);Other abnormalities of gait and mobility (  R26.89)                Time: 1610-9604 OT Time Calculation (min): 30 min Charges:  OT General Charges $OT Visit: 1 Visit OT Evaluation $OT Eval Low Complexity: 1 Low OT Treatments $Self Care/Home Management : 8-22 mins G-Codes:     Marcy Siren, OT Pager [phone] 11/11/2016   Orlando Penner 11/11/2016, 3:01 PM

## 2016-11-11 NOTE — Progress Notes (Signed)
Patient ID: Patrick BullaParminder Spielberg, male   DOB: [birth year], 61 y.o.   MRN: [id number] BP 112/63 (BP Location: Left Arm)   Pulse 100   Temp 98.4 F (36.9 C) (Oral)   Resp 20   Ht 6' (1.829 m)   Wt 122.9 kg (271 lb)   SpO2 98%   BMI 36.75 kg/m  Alert and oriented x 4, speech Is clear and fluent Moving all extremities well Wound is clean, dry, and without signs of infection.  Discharge tomorrow.

## 2016-11-11 NOTE — Care Management Note (Signed)
Case Management Note  Patient Details  Name: Patrick Hill MRN: [id number] Date of Birth: [birth year]  Subjective/Objective:                    Action/Plan: Plan is for patient to d/c home with self care when medically ready. Pt has received walker for home use. Patient will have transportation home. No further needs per CM.   Expected Discharge Date:                  Expected Discharge Plan:  Home/Self Care  In-House Referral:     Discharge planning Services  CM Consult  Post Acute Care Choice:  Durable Medical Equipment Choice offered to:     DME Arranged:  Walker rolling DME Agency:  [facility]  [facility]     Status of Service:  Completed, signed off  If discussed at Long Length of Stay Meetings, dates discussed:    Additional Comments:  Kermit BaloKelli F Guiliana Shor, RN 11/11/2016, 10:38 AM

## 2016-11-11 NOTE — Progress Notes (Signed)
Patient walked to bathroom , around room, patient sat at the chair to bathe and then in recliner for an hour.

## 2016-11-11 NOTE — Discharge Summary (Signed)
  Physician Discharge Summary  Patient ID: Patrick Hill MRN: [id number] DOB/AGE: 12-07-1955 61 y.o.  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 male Whom was admitted to the hospital for a lumbar decompression and discetomy. His procedure went smoothly, post op he has done well. He is ambulating, voiding, and tolerating a regular diet. His wound is clean, dry, and without signs of infection.  Treatments: surgery: POSTERIOR LUMBAR INTERBODY FUSION LUMBAR 4- LUMBAR 5, auto and allograft morsels,  LUMBAR 2- LUMBAR 3 semihemilaminectomy and foraminotomy LEFT Non segmental pedicle screw fixation L4,5     Discharge Exam: Blood pressure 101/68, pulse 85, temperature 98 F (36.7 C), temperature source Oral, resp. rate 20, height 6' (1.829 m), weight 122.9 kg (271 lb), SpO2 97 %. General appearance: alert, cooperative, appears stated age and no distress Neurologic: Alert and oriented X 3, normal strength and tone. Normal symmetric reflexes. Normal coordination and gait  Disposition: 01-Home or Self Care NEUROGENIC CLAUDICATION DUE TO LUMBAR STENOSIS  Allergies as of 11/11/2016   No Known Allergies     Medication List    STOP taking these medications   oxyCODONE-acetaminophen 5-325 MG tablet Commonly known as:  PERCOCET/ROXICET     TAKE these medications   HYDROcodone-acetaminophen 5-325 MG tablet Commonly known as:  NORCO/VICODIN Take 1-2 tablets by mouth every 6 (six) hours as needed for moderate pain. What changed:  how much to take  when to take this   tiZANidine 4 MG tablet Commonly known as:  ZANAFLEX Take 1 tablet (4 mg total) by mouth every 6 (six) hours as needed  for muscle spasms.            Durable Medical Equipment        Start     Ordered   11/10/16 1610  For home use only DME Walker rolling  Once    Question:  Patient needs a walker to treat with the following condition  Answer:  Status post lumbar surgery   11/10/16 1609       Discharge Care Instructions        Start     Ordered   11/11/16 0000  HYDROcodone-acetaminophen (NORCO/VICODIN) 5-325 MG tablet  Every 6 hours PRN     11/11/16 1829   11/11/16 0000  tiZANidine (ZANAFLEX) 4 MG tablet  Every 6 hours PRN     11/11/16 1829     Follow-up Information    Coletta Memosabbell, Fernanda Twaddell, MD Follow up in 3 week(s).   Specialty:  Neurosurgery Why:  please call the office to make an appointment for 3 weeks Contact information: [address] [place] [address] [phone]           Signed: Carmela HurtCABBELL,Divit Stipp L 11/11/2016, 6:30 PM

## 2016-11-11 NOTE — Progress Notes (Signed)
Physical Therapy Treatment Patient Details Name: Patrick Hill MRN: [id number] DOB: 05/17/1955 Today's Date: 11/11/2016    History of Present Illness Pt is a 61 y/o male s/p PLIF L4-5. PMH including but not limited to HTN, DM and hx of MI.    PT Comments    Pt making good progress towards achieving his current functional mobility goals. PT will continue to follow acutely to ensure a safe d/c home and for stair training next session.    Follow Up Recommendations  No PT follow up;Supervision/Assistance - 24 hour     Equipment Recommendations  Rolling walker with 5" wheels    Recommendations for Other Services       Precautions / Restrictions Precautions Precautions: Fall;Back Precaution Comments: Reviewed 3/3 back precautions and handout provided  Required Braces or Orthoses: Spinal Brace Spinal Brace: Lumbar corset;Applied in sitting position Restrictions Weight Bearing Restrictions: No    Mobility  Bed Mobility Overal bed mobility: Needs Assistance Bed Mobility: Rolling;Sidelying to Sit;Sit to Sidelying Rolling: Supervision Sidelying to sit: Min guard     Sit to sidelying: Min guard General bed mobility comments: increased time and effort, use of bed rails, min gaurd for safety  Transfers Overall transfer level: Needs assistance Equipment used: Rolling walker (2 wheeled) Transfers: Sit to/from Stand Sit to Stand: Min guard         General transfer comment: increased time and effort, good technique, min guard for safety  Ambulation/Gait Ambulation/Gait assistance: Min guard Ambulation Distance (Feet): 150 Feet Assistive device: Rolling walker (2 wheeled) Gait Pattern/deviations: Step-through pattern;Decreased stride length Gait velocity: decreased Gait velocity interpretation: Below normal speed for age/gender General Gait Details: no instability or LOB, min guard for safety; slow, cautious gait   Stairs            Wheelchair Mobility     Modified Rankin (Stroke Patients Only)       Balance Overall balance assessment: Needs assistance Sitting-balance support: Feet supported Sitting balance-Leahy Scale: Good Sitting balance - Comments: static sitting EOB during education on AE    Standing balance support: During functional activity;Bilateral upper extremity supported Standing balance-Leahy Scale: Poor Standing balance comment: pt reliant on bilateral UEs on RW during mobility; able to maintain static standing to wash hands at sink with close guard for safety                             Cognition Arousal/Alertness: Awake/alert Behavior During Therapy: WFL for tasks assessed/performed Overall Cognitive Status: Within Functional Limits for tasks assessed                                        Exercises      General Comments        Pertinent Vitals/Pain Pain Assessment: 0-10 Pain Score: 6  Pain Location: back Pain Descriptors / Indicators: Sore;Discomfort Pain Intervention(s): Monitored during session;Repositioned    Home Living Family/patient expects to be discharged to:: Private residence Living Arrangements: Spouse/significant other;Children Available Help at Discharge: Family;Available 24 hours/day Type of Home: House Home Access: Stairs to enter   Home Layout: One level Home Equipment: Shower seat      Prior Function Level of Independence: Independent      Comments: works for [employer]    PT Goals (current goals can now be found in the care plan section) Acute Rehab PT Goals  Patient Stated Goal: return home, decrease pain PT Goal Formulation: With patient Time For Goal Achievement: 11/24/16 Potential to Achieve Goals: Good Progress towards PT goals: Progressing toward goals    Frequency    Min 5X/week      PT Plan Current plan remains appropriate    Co-evaluation              AM-PAC PT "6 Clicks" Daily Activity  Outcome Measure  Difficulty  turning over in bed (including adjusting bedclothes, sheets and blankets)?: A Little Difficulty moving from lying on back to sitting on the side of the bed? : A Little Difficulty sitting down on and standing up from a chair with arms (e.g., wheelchair, bedside commode, etc,.)?: Unable Help needed moving to and from a bed to chair (including a wheelchair)?: A Little Help needed walking in hospital room?: A Little Help needed climbing 3-5 steps with a railing? : A Little 6 Click Score: 16    End of Session Equipment Utilized During Treatment: Gait belt;Back brace Activity Tolerance: Patient tolerated treatment well Patient left: in bed;with call bell/phone within reach;with bed alarm set Nurse Communication: Mobility status PT Visit Diagnosis: Other abnormalities of gait and mobility (R26.89);Pain Pain - part of body:  (back)     Time: 9604-5409 PT Time Calculation (min) (ACUTE ONLY): 13 min  Charges:  $Gait Training: 8-22 mins                    G Codes:       [place], [place], [place] [phone]    Alessandra Bevels Mariyah Upshaw 11/11/2016, 3:06 PM

## 2016-11-12 NOTE — Progress Notes (Signed)
Pt discharge education and instructions completed with pt and spouse at bedside; both voices understanding and denies any questions. Pt IV removed; back incision dsg changed, clean dry and intact with honeycomb dsg. Pt discharge home with spouse to transport him home. Pt awaiting on his Norco and Zanaflex prescription to be signed by MD. Will hand to pt once signed. Dionne BucyP. Amo Sharmane Dame RN

## 2016-11-12 NOTE — Progress Notes (Signed)
Pt Norco and Zanaflex prescription signed by MD and handed to pt's spouse. Pt transported off unit via wheelchair with spouse and belongings to the side. Dionne BucyP. Amo Renae Mottley RN

## 2016-11-12 NOTE — Progress Notes (Signed)
Physical Therapy Treatment Patient Details Name: Patrick Hill MRN: [id number] DOB: June 11, 1955 Today's Date: 11/12/2016    History of Present Illness Pt is a 61 y/o male s/p PLIF L4-5. PMH including but not limited to HTN, DM and hx of MI.    PT Comments    Pt performed increased mobility and reviewed stair training for safe entry into home.  Pt required cues for spinal precautions and correcting RW height to improve posture.  Plan to d/c home today with support from spouse.     Follow Up Recommendations  No PT follow up;Supervision/Assistance - 24 hour     Equipment Recommendations  Rolling walker with 5" wheels    Recommendations for Other Services       Precautions / Restrictions Precautions Precautions: Fall;Back Precaution Comments: Pt able to recall 3/3 spinal precautions Required Braces or Orthoses: Spinal Brace Spinal Brace: Lumbar corset;Applied in sitting position Restrictions Weight Bearing Restrictions: No Other Position/Activity Restrictions: Pt able to apply brace independently.  Pt edcuated to expand corset completely.      Mobility  Bed Mobility               General bed mobility comments: Pt sitting edge of bed and RN changing honey comb dressing.    Transfers Overall transfer level: Needs assistance Equipment used: Rolling walker (2 wheeled) Transfers: Sit to/from Stand Sit to Stand: Supervision         General transfer comment: increased time and effort, good technique, supervision for safety  Ambulation/Gait Ambulation/Gait assistance: Supervision Ambulation Distance (Feet): 100 Feet Assistive device: Rolling walker (2 wheeled) Gait Pattern/deviations: Step-through pattern;Decreased stride length Gait velocity: decreased   General Gait Details: Cues for RW height to improve posture as RW too short for patient.  Cues for upper trunk control.     Stairs Stairs: Yes   Stair Management: Two rails Number of Stairs: 2 General stair  comments: Cues for sequencing and hand placement on rails to remain safe.  Supervision for safety.  Educated spouse to place RW at the top of the stairs.    Wheelchair Mobility    Modified Rankin (Stroke Patients Only)       Balance Overall balance assessment: Needs assistance Sitting-balance support: Feet supported Sitting balance-Leahy Scale: Good Sitting balance - Comments: static sitting EOB during education on AE    Standing balance support: During functional activity;Bilateral upper extremity supported Standing balance-Leahy Scale: Fair Standing balance comment: Pt able to maintain static standing to adjust back brace with Min Guard for safety                            Cognition Arousal/Alertness: Awake/alert Behavior During Therapy: WFL for tasks assessed/performed Overall Cognitive Status: Within Functional Limits for tasks assessed                                        Exercises      General Comments General comments (skin integrity, edema, etc.): Wife present throughout session      Pertinent Vitals/Pain Pain Assessment: Faces Pain Score: 6  Faces Pain Scale: Hurts little more Pain Location: back Pain Descriptors / Indicators: Sore;Discomfort Pain Intervention(s): Monitored during session;Repositioned    Home Living                      Prior Function  PT Goals (current goals can now be found in the care plan section) Acute Rehab PT Goals Patient Stated Goal: return home, decrease pain Potential to Achieve Goals: Good Progress towards PT goals: Progressing toward goals    Frequency    Min 5X/week      PT Plan Current plan remains appropriate    Co-evaluation              AM-PAC PT "6 Clicks" Daily Activity  Outcome Measure  Difficulty turning over in bed (including adjusting bedclothes, sheets and blankets)?: A Little Difficulty moving from lying on back to sitting on the side of the  bed? : A Little Difficulty sitting down on and standing up from a chair with arms (e.g., wheelchair, bedside commode, etc,.)?: A Little Help needed moving to and from a bed to chair (including a wheelchair)?: A Little Help needed walking in hospital room?: A Little Help needed climbing 3-5 steps with a railing? : A Little 6 Click Score: 18    End of Session Equipment Utilized During Treatment: Gait belt;Back brace Activity Tolerance: Patient tolerated treatment well Patient left: in bed;with call bell/phone within reach;with bed alarm set Nurse Communication: Mobility status PT Visit Diagnosis: Other abnormalities of gait and mobility (R26.89);Pain Pain - part of body:  (back)     Time: 4098-11911023-1033 PT Time Calculation (min) (ACUTE ONLY): 10 min  Charges:  $Gait Training: 8-22 mins                    G Codes:       Joycelyn RuaAimee Doris Mcgilvery, PTA pager [phone]    Florestine Aversimee J Marilouise Densmore 11/12/2016, 10:39 AM

## 2016-11-12 NOTE — Progress Notes (Signed)
Occupational Therapy Treatment Patient Details Name: Patrick Hill MRN: [id number] DOB: 1956-02-06 Today's Date: 11/12/2016    History of present illness Pt is a 61 y/o male s/p PLIF L4-5. PMH including but not limited to HTN, DM and hx of MI.   OT comments  Pt progressing towards OT goals. Pt continues to require Min VCs for back precautions and reviewed precautions. Provided education on donning/doffing and managing back brace; pt and wife demonstrating understanding. Educated pt and wife on AE for LB ADLs. Pt and wife reporting that wife will assist with LB ADLs. Pt stating he may want reacher; provided handout for AE. Answered all questions in preparation for dc later today. Continues to recommend dc home with 24 hour supervision/assistance.   Follow Up Recommendations  No OT follow up;Supervision/Assistance - 24 hour    Equipment Recommendations  Other (comment) (Pt declining 3:1 at this time )    Recommendations for Other Services      Precautions / Restrictions Precautions Precautions: Fall;Back Precaution Comments: Pt recalled 2/3 precautions with Min VCs for lift. Reviewed 3/3 back precautions and handout provided. Required Braces or Orthoses: Spinal Brace Spinal Brace: Lumbar corset;Applied in sitting position Restrictions Weight Bearing Restrictions: No       Mobility Bed Mobility               General bed mobility comments: Reviewed log roll with pt and wife; pt verbalized understanding  Transfers Overall transfer level: Needs assistance Equipment used: Rolling walker (2 wheeled) Transfers: Sit to/from Stand Sit to Stand: Min guard         General transfer comment: increased time and effort, good technique, min guard for safety    Balance Overall balance assessment: Needs assistance Sitting-balance support: Feet supported Sitting balance-Leahy Scale: Good Sitting balance - Comments: static sitting EOB during education on AE    Standing balance  support: During functional activity;Bilateral upper extremity supported Standing balance-Leahy Scale: Fair Standing balance comment: Pt able to maintain static standing to adjust back brace with Min Guard for safety                           ADL either performed or assessed with clinical judgement   ADL Overall ADL's : Needs assistance/impaired             Lower Body Bathing: Sit to/from stand;With caregiver independent assisting Lower Body Bathing Details (indicate cue type and reason): Reviewed AE. Wife confirms that she will assist Upper Body Dressing : Sitting;With caregiver independent assisting;Minimal assistance Upper Body Dressing Details (indicate cue type and reason): Min A to don back brace. Educated wife of donning/doffing brace to increase safety at home.  Lower Body Dressing: Sit to/from stand;With caregiver independent assisting;With adaptive equipment Lower Body Dressing Details (indicate cue type and reason): Pt already dressed upon arrival. Pt wife confirms that she will assist at home. Educated on AE. Provided handout with AE information.             Functional mobility during ADLs: Min guard (sit<>stand) General ADL Comments: Educated wife and pt on AE for LB ADLs. Pt and wife confirms that wife will assist with LB ADLs.  Pt stating he may want reacher; provided handout to assist with purchase. Educated on donning/doffing of back brace; wife and pt demonstrating understanding     Vision       Perception     Praxis      Cognition Arousal/Alertness: Awake/alert Behavior  During Therapy: WFL for tasks assessed/performed Overall Cognitive Status: Within Functional Limits for tasks assessed                                          Exercises     Shoulder Instructions       General Comments Wife present throughout session    Pertinent Vitals/ Pain       Pain Assessment: Faces Faces Pain Scale: Hurts little more Pain  Location: back Pain Descriptors / Indicators: Sore;Discomfort Pain Intervention(s): Monitored during session;Repositioned  Home Living                                          Prior Functioning/Environment              Frequency  Min 2X/week        Progress Toward Goals  OT Goals(current goals can now be found in the care plan section)  Progress towards OT goals: Progressing toward goals  Acute Rehab OT Goals Patient Stated Goal: return home, decrease pain OT Goal Formulation: With patient Time For Goal Achievement: 11/25/16 Potential to Achieve Goals: Good ADL Goals Pt Will Perform Grooming: with modified independence;standing Pt Will Perform Upper Body Bathing: with modified independence;sitting Pt Will Perform Lower Body Bathing: with min assist;with adaptive equipment;sit to/from stand;with caregiver independent in assisting (AE PRN ) Pt Will Perform Lower Body Dressing: with min assist;with caregiver independent in assisting;with adaptive equipment;sit to/from stand (AE PRN ) Pt Will Transfer to Toilet: with modified independence;ambulating;regular height toilet;grab bars Pt Will Perform Toileting - Clothing Manipulation and hygiene: with min guard assist;sit to/from stand Pt Will Perform Tub/Shower Transfer: Shower transfer;with min guard assist;ambulating;shower seat;rolling walker  Plan Discharge plan remains appropriate    Co-evaluation                 AM-PAC PT "6 Clicks" Daily Activity     Outcome Measure   Help from another person eating meals?: None Help from another person taking care of personal grooming?: A Little Help from another person toileting, which includes using toliet, bedpan, or urinal?: A Little Help from another person bathing (including washing, rinsing, drying)?: A Lot Help from another person to put on and taking off regular upper body clothing?: A Little Help from another person to put on and taking off  regular lower body clothing?: A Lot 6 Click Score: 17    End of Session Equipment Utilized During Treatment: Back brace  OT Visit Diagnosis: Unsteadiness on feet (R26.81);Other abnormalities of gait and mobility (R26.89)   Activity Tolerance Patient tolerated treatment well   Patient Left with call bell/phone within reach;in chair;with family/visitor present   Nurse Communication Mobility status;Other (comment) (Questions on banadage and medication)        Time: 4098-11910924-0941 OT Time Calculation (min): 17 min  Charges: OT General Charges $OT Visit: 1 Visit OT Treatments $Self Care/Home Management : 8-22 mins  Jemia Fata MSOT, OTR/L Acute Rehab Pager: [phone] Office: [phone]   Theodoro GristCharis M Louanna Vanliew 11/12/2016, 9:53 AM

## 2017-01-30 DIAGNOSIS — M7711 Lateral epicondylitis, right elbow: Secondary | ICD-10-CM | POA: Diagnosis not present

## 2017-01-30 DIAGNOSIS — L309 Dermatitis, unspecified: Secondary | ICD-10-CM | POA: Diagnosis not present

## 2017-02-24 DIAGNOSIS — L2082 Flexural eczema: Secondary | ICD-10-CM | POA: Diagnosis not present

## 2017-02-24 DIAGNOSIS — Z0000 Encounter for general adult medical examination without abnormal findings: Secondary | ICD-10-CM | POA: Diagnosis not present

## 2017-02-24 DIAGNOSIS — R030 Elevated blood-pressure reading, without diagnosis of hypertension: Secondary | ICD-10-CM | POA: Diagnosis not present

## 2017-02-24 DIAGNOSIS — I872 Venous insufficiency (chronic) (peripheral): Secondary | ICD-10-CM | POA: Diagnosis not present

## 2017-03-17 DIAGNOSIS — B86 Scabies: Secondary | ICD-10-CM | POA: Diagnosis not present

## 2017-04-14 DIAGNOSIS — L853 Xerosis cutis: Secondary | ICD-10-CM | POA: Diagnosis not present

## 2017-04-14 DIAGNOSIS — B86 Scabies: Secondary | ICD-10-CM | POA: Diagnosis not present

## 2017-04-24 DIAGNOSIS — K080 Exfoliation of teeth due to systemic causes: Secondary | ICD-10-CM | POA: Diagnosis not present

## 2017-06-02 DIAGNOSIS — L853 Xerosis cutis: Secondary | ICD-10-CM | POA: Diagnosis not present

## 2017-06-02 DIAGNOSIS — L298 Other pruritus: Secondary | ICD-10-CM | POA: Diagnosis not present

## 2018-05-04 ENCOUNTER — Other Ambulatory Visit: Payer: Self-pay | Admitting: Neurosurgery

## 2018-06-06 NOTE — Pre-Procedure Instructions (Signed)
Gurinder Yabut  06/06/2018      [id number] - [place], [place] - [address] AT [facility] OF [address] [place] [address] Phone: [phone] Fax: [phone]  [id number] - [place], [place] - [address] [place] [address] Phone: [phone] Fax: [phone]    Your procedure is scheduled on April 3  Report to [facility] Entrance A at 7:00A.M.  Call this number if you have problems the morning of surgery:  [phone]   Remember:  Do not eat or drink after midnight.      Take these medicines the morning of surgery with A SIP OF WATER     Do not wear jewelry.  Do not wear lotions, powders, or perfumes, or deodorant.  Do not shave 48 hours prior to surgery.  Men may shave face and neck.  Do not bring valuables to the hospital.  [facility] is not responsible for any belongings or valuables.  Contacts, dentures or bridgework may not be worn into surgery.  Leave your suitcase in the car.  After surgery it may be brought to your room.  For patients admitted to the hospital, discharge time will be determined by your treatment team.  Patients discharged the day of surgery will not be allowed to drive home.    Special instructions:   [place]- Preparing For Surgery  Before surgery, you can play an important role. Because skin is not sterile, your skin needs to be as free of germs as possible. You can reduce the number of germs on your skin by washing with CHG (chlorahexidine gluconate) Soap before surgery.  CHG is an antiseptic cleaner which kills germs and bonds with the skin to continue killing germs even after washing.    Oral Hygiene is also important to reduce your risk of infection.  Remember - BRUSH YOUR TEETH THE MORNING OF SURGERY WITH YOUR REGULAR TOOTHPASTE  Please do not use if you have an allergy to CHG or antibacterial soaps. If your skin becomes reddened/irritated  stop using the CHG.  Do not shave (including legs and underarms) for at least 48 hours prior to first CHG shower. It is OK to shave your face.  Please follow these instructions carefully.   1. Shower the NIGHT BEFORE SURGERY and the MORNING OF SURGERY with CHG.   2. If you chose to wash your hair, wash your hair first as usual with your normal shampoo.  3. After you shampoo, rinse your hair and body thoroughly to remove the shampoo.  4. Use CHG as you would any other liquid soap. You can apply CHG directly to the skin and wash gently with a scrungie or a clean washcloth.   5. Apply the CHG Soap to your body ONLY FROM THE NECK DOWN.  Do not use on open wounds or open sores. Avoid contact with your eyes, ears, mouth and genitals (private parts). Wash Face and genitals (private parts)  with your normal soap.  6. Wash thoroughly, paying special attention to the area where your surgery will be performed.  7. Thoroughly rinse your body with warm water from the neck down.  8. DO NOT shower/wash with your normal soap after using and rinsing off the CHG Soap.  9. Pat yourself dry with a CLEAN TOWEL.  10. Wear CLEAN PAJAMAS to bed the night before surgery, wear comfortable clothes the morning of surgery  11. Place  CLEAN SHEETS on your bed the night of your first shower and DO NOT SLEEP WITH PETS.    Day of Surgery:  Do not apply any deodorants/lotions.  Please wear clean clothes to the hospital/surgery center.   Remember to brush your teeth WITH YOUR REGULAR TOOTHPASTE.    Please read over the following fact sheets that you were given. Coughing and Deep Breathing, MRSA Information and Surgical Site Infection Prevention

## 2018-06-07 ENCOUNTER — Inpatient Hospital Stay (HOSPITAL_COMMUNITY)
Admission: RE | Admit: 2018-06-07 | Discharge: 2018-06-07 | Disposition: A | Discharge status: Home / Self Care | Payer: Self-pay | Source: Ambulatory Visit

## 2018-07-24 IMAGING — RF DG LUMBAR SPINE 2-3V
1 series · 3 of 3 positions shown · non-contrast
Comparison: None.

CLINICAL DATA: Lumbar fusion.

EXAM:
DG C-ARM 61-120 MIN; LUMBAR SPINE - 2-3 VIEW

[Series 1: run · 3 of 3 slices shown]
[im 1/3]
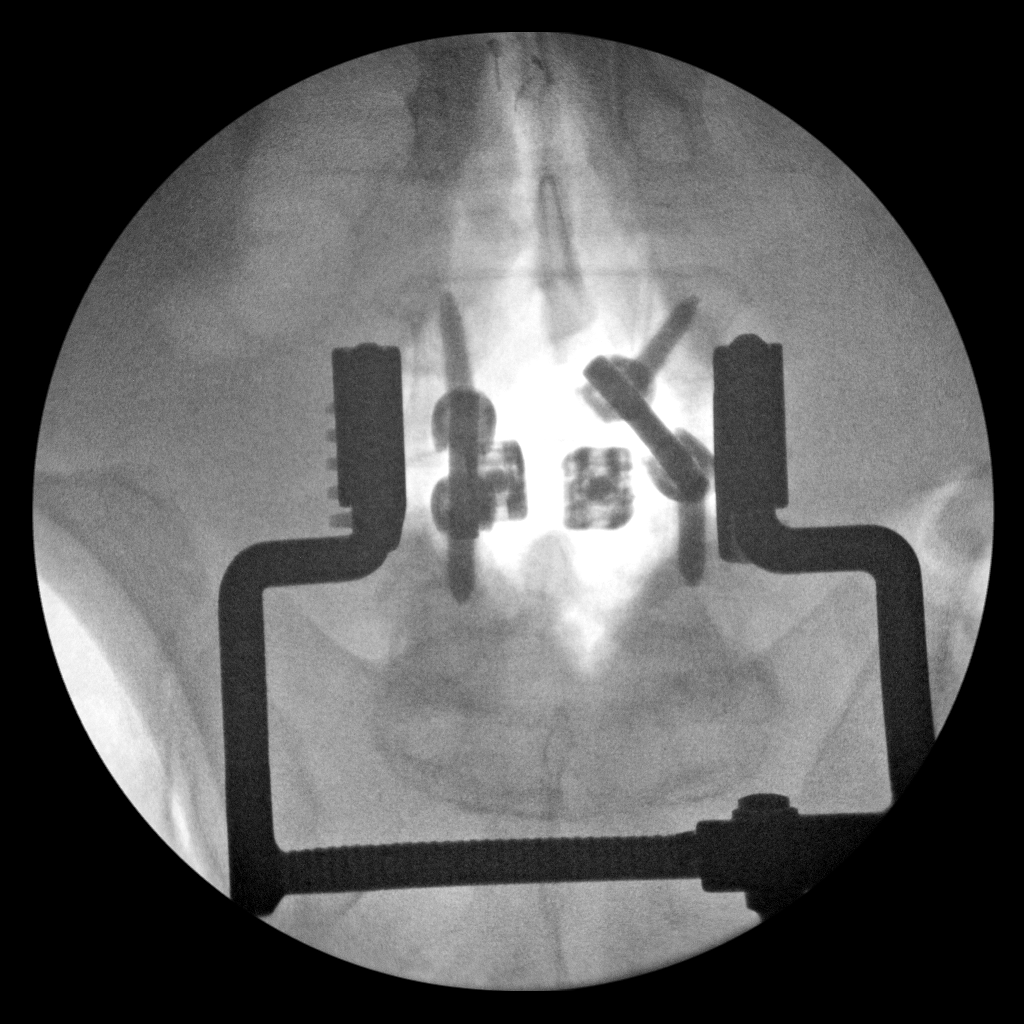
[im 2/3]
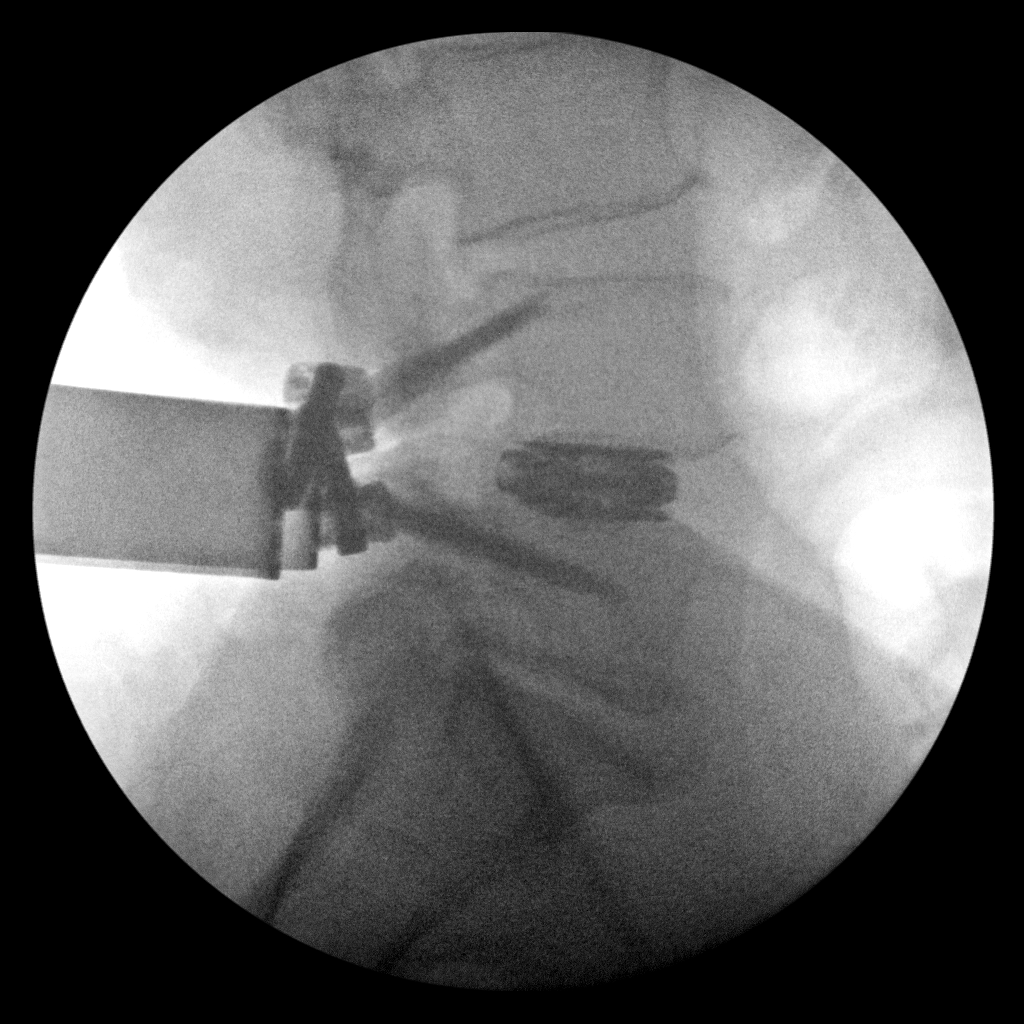
[im 3/3]
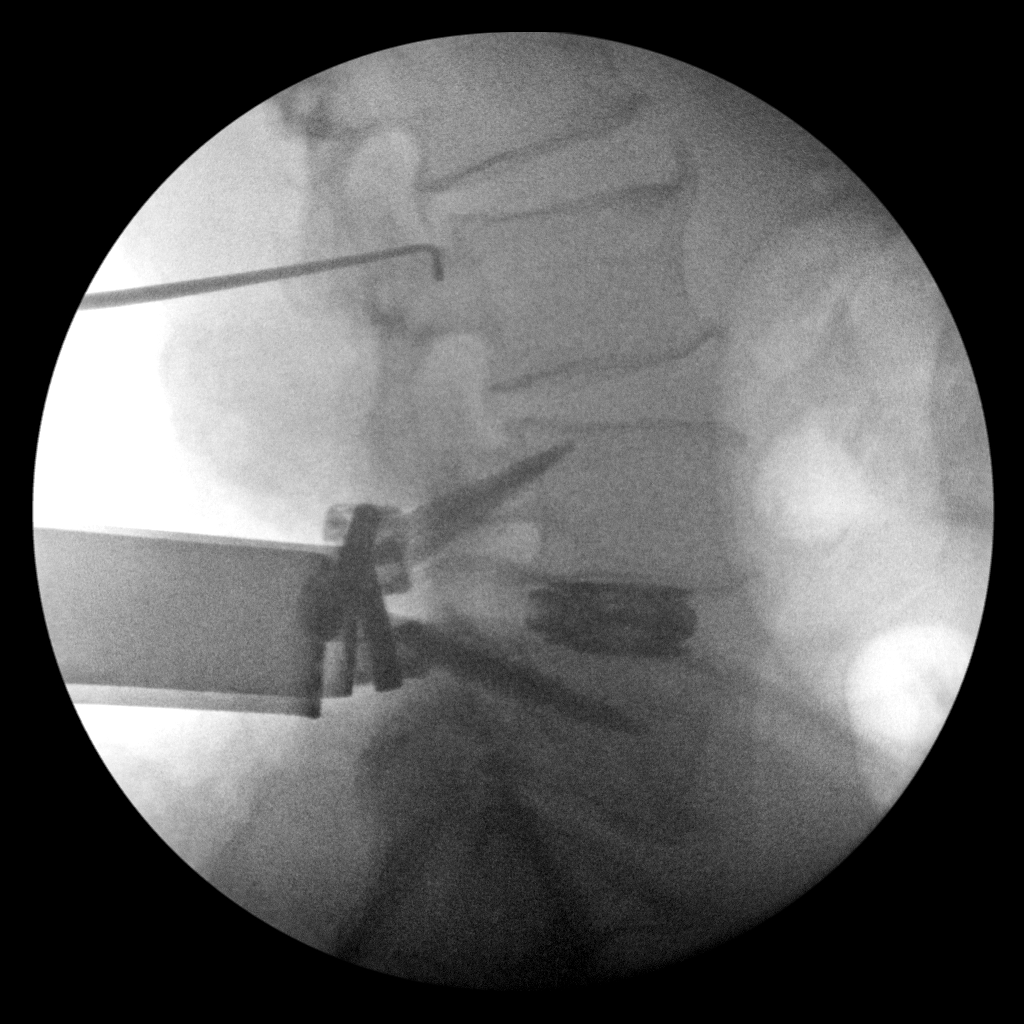

[3 of 3 positions shown; findings below may reference images not displayed]

FINDINGS: Fluoroscopy time 1 minutes 23 seconds. 3 nondiagnostic spot
fluoroscopic intraoperative lower lumbar spine radiographs were
provided, which demonstrate postsurgical changes from bilateral
posterior spinal fusion at L4-5 with bone cage in the L4-5 disc
space.
IMPRESSION: Intraoperative fluoroscopic guidance for lumbar fusion.

## 2018-08-10 DIAGNOSIS — H04123 Dry eye syndrome of bilateral lacrimal glands: Secondary | ICD-10-CM | POA: Diagnosis not present

## 2018-08-14 DIAGNOSIS — Z1159 Encounter for screening for other viral diseases: Secondary | ICD-10-CM | POA: Diagnosis not present

## 2018-08-15 ENCOUNTER — Inpatient Hospital Stay (HOSPITAL_COMMUNITY): Admission: RE | Admit: 2018-08-15 | Payer: Self-pay | Source: Home / Self Care

## 2018-08-15 ENCOUNTER — Encounter (HOSPITAL_COMMUNITY): Admission: RE | Payer: Self-pay | Source: Home / Self Care

## 2018-08-15 SURGERY — POSTERIOR LUMBAR FUSION 1 WITH HARDWARE REMOVAL
Anesthesia: General

## 2018-09-17 DIAGNOSIS — H04123 Dry eye syndrome of bilateral lacrimal glands: Secondary | ICD-10-CM | POA: Diagnosis not present

## 2018-10-18 ENCOUNTER — Other Ambulatory Visit: Payer: Self-pay | Admitting: Orthopedic Surgery

## 2018-11-02 NOTE — Patient Instructions (Addendum)
YOU NEED TO HAVE A COVID 19 TEST ON 11-13-18  @ 10;30 AM, THIS TEST MUST BE DONE BEFORE SURGERY, COME  [address], [place] , [phone]. ONCE YOUR COVID TEST IS COMPLETED, PLEASE BEGIN THE QUARANTINE INSTRUCTIONS AS OUTLINED IN YOUR HANDOUT.                Patrick Hill  11/02/2018   Your procedure is scheduled on: Friday 11/16/2018   Report to [facility] Main  Entrance              Report to Short Stay at 05:30  AM               1 VISITOR IS ALLOWED TO WAIT IN WAITING ROOM  ONLY DAY OF YOUR SURGERY. NO VISITORS ARE ALLOWED IN SHORT STAY OR RECOVERY ROOM.   Call this number if you have problems the morning of surgery [phone]    Remember: Do not eat food or drink liquids :After Midnight.               Take these medicines the morning of surgery with A SIP OF WATER: use Hydrocodone-acetaminophen if needed. You may also use and bring your eyedrops  BRUSH YOUR TEETH MORNING OF SURGERY AND RINSE YOUR MOUTH OUT, NO CHEWING GUM CANDY OR MINTS.                                    You may not have any metal on your body including hair pins and              piercings     Do not wear jewelry, cologne, lotions, powders or deodorant                          Men may shave face and neck.   Do not bring valuables to the hospital. [facility] IS NOT             RESPONSIBLE   FOR VALUABLES.  Contacts, dentures or bridgework may not be worn into surgery.  Leave suitcase in the car. After surgery it may be brought to your room.                  Please read over the following fact sheets you were given: _____________________________________________________________________             [facility] - Preparing for Surgery Before surgery, you can play an important role.  Because skin is not sterile, your skin needs to be as free of germs as possible.  You can reduce the number of germs on your skin by washing with CHG (chlorahexidine gluconate) soap before surgery.  CHG  is an antiseptic cleaner which kills germs and bonds with the skin to continue killing germs even after washing. Please DO NOT use if you have an allergy to CHG or antibacterial soaps.  If your skin becomes reddened/irritated stop using the CHG and inform your nurse when you arrive at Short Stay. Do not shave (including legs and underarms) for at least 48 hours prior to the first CHG shower.  You may shave your face/neck. Please follow these instructions carefully:  1.  Shower with CHG Soap the night before surgery and the  morning of Surgery.  2.  If you choose to wash your hair, wash your hair first as usual with  your  normal  shampoo.  3.  After you shampoo, rinse your hair and body thoroughly to remove the  shampoo.                           4.  Use CHG as you would any other liquid soap.  You can apply chg directly  to the skin and wash                       Gently with a scrungie or clean washcloth.  5.  Apply the CHG Soap to your body ONLY FROM THE NECK DOWN.   Do not use on face/ open                           Wound or open sores. Avoid contact with eyes, ears mouth and genitals (private parts).                       Wash face,  Genitals (private parts) with your normal soap.             6.  Wash thoroughly, paying special attention to the area where your surgery  will be performed.  7.  Thoroughly rinse your body with warm water from the neck down.  8.  DO NOT shower/wash with your normal soap after using and rinsing off  the CHG Soap.                9.  Pat yourself dry with a clean towel.            10.  Wear clean pajamas.            11.  Place clean sheets on your bed the night of your first shower and do not  sleep with pets. Day of Surgery : Do not apply any lotions/deodorants the morning of surgery.  Please wear clean clothes to the hospital/surgery center.  FAILURE TO FOLLOW THESE INSTRUCTIONS MAY RESULT IN THE CANCELLATION OF YOUR SURGERY PATIENT  SIGNATURE_________________________________  NURSE SIGNATURE__________________________________  ________________________________________________________________________

## 2018-11-05 ENCOUNTER — Encounter (HOSPITAL_COMMUNITY): Payer: Self-pay

## 2018-11-05 ENCOUNTER — Other Ambulatory Visit: Payer: Self-pay

## 2018-11-05 ENCOUNTER — Other Ambulatory Visit: Payer: Self-pay | Admitting: Orthopedic Surgery

## 2018-11-05 ENCOUNTER — Encounter (HOSPITAL_COMMUNITY)
Admission: RE | Admit: 2018-11-05 | Discharge: 2018-11-05 | Disposition: A | Discharge status: Home / Self Care | Source: Ambulatory Visit | Attending: Orthopedic Surgery | Admitting: Orthopedic Surgery

## 2018-11-05 DIAGNOSIS — Z01812 Encounter for preprocedural laboratory examination: Secondary | ICD-10-CM | POA: Insufficient documentation

## 2018-11-05 LAB — SURGICAL PCR SCREEN
MRSA, PCR: NEGATIVE
Staphylococcus aureus: NEGATIVE

## 2018-11-05 LAB — CBC
HCT: 45.4 % (ref 39.0–52.0)
Hemoglobin: 14.6 g/dL (ref 13.0–17.0)
MCH: 30.7 pg (ref 26.0–34.0)
MCHC: 32.2 g/dL (ref 30.0–36.0)
MCV: 95.6 fL (ref 80.0–100.0)
Platelets: 218 10*3/uL (ref 150–400)
RBC: 4.75 MIL/uL (ref 4.22–5.81)
RDW: 13.2 % (ref 11.5–15.5)
WBC: 4.4 10*3/uL (ref 4.0–10.5)
nRBC: 0 % (ref 0.0–0.2)

## 2018-11-05 NOTE — Progress Notes (Signed)
PCP - No PCP Cardiologist -   Chest x-ray -  EKG -  Stress Test -  ECHO -  Cardiac Cath -   Sleep Study -  CPAP -   Fasting Blood Sugar -  Checks Blood Sugar _____ times a day  Blood Thinner Instructions: Aspirin Instructions: Last Dose:  Anesthesia review:   Patient denies shortness of breath, fever, cough and chest pain at PAT appointment   Patient verbalized understanding of instructions that were given to them at the PAT appointment. Patient was also instructed that they will need to review over the PAT instructions again at home before surgery. 

## 2018-11-13 ENCOUNTER — Other Ambulatory Visit (HOSPITAL_COMMUNITY)
Admission: RE | Admit: 2018-11-13 | Discharge: 2018-11-13 | Disposition: A | Discharge status: Home / Self Care | Source: Ambulatory Visit | Attending: Orthopedic Surgery | Admitting: Orthopedic Surgery

## 2018-11-13 DIAGNOSIS — Z01812 Encounter for preprocedural laboratory examination: Secondary | ICD-10-CM | POA: Diagnosis not present

## 2018-11-13 DIAGNOSIS — Z20828 Contact with and (suspected) exposure to other viral communicable diseases: Secondary | ICD-10-CM | POA: Insufficient documentation

## 2018-11-13 LAB — SARS CORONAVIRUS 2 (TAT 6-24 HRS): SARS Coronavirus 2: NEGATIVE

## 2018-11-15 MED ORDER — DEXTROSE 5 % IV SOLN
3.0000 g | INTRAVENOUS | Status: AC
  Administered 2018-11-16: 3 g via INTRAVENOUS
  Filled 2018-11-15: qty 3

## 2018-11-15 MED ORDER — BUPIVACAINE LIPOSOME 1.3 % IJ SUSP
20.0000 mL | Freq: Once | INTRAMUSCULAR | Status: DC
  Filled 2018-11-15: qty 20

## 2018-11-15 NOTE — Anesthesia Preprocedure Evaluation (Addendum)
Anesthesia Evaluation  Patient identified by MRN, date of birth, ID band Patient awake    Reviewed: Allergy & Precautions, NPO status , Patient's Chart, lab work & pertinent test results  History of Anesthesia Complications Negative for: history of anesthetic complications  Airway Mallampati: II  TM Distance: >3 FB Neck ROM: Full    Dental  (+) Dental Advisory Given, Teeth Intact   Pulmonary neg pulmonary ROS,    Pulmonary exam normal        Cardiovascular (-) anginanegative cardio ROS Normal cardiovascular exam     Neuro/Psych negative neurological ROS  negative psych ROS   GI/Hepatic negative GI ROS, Neg liver ROS,   Endo/Other   Obesity   Renal/GU negative Renal ROS     Musculoskeletal  (+) Arthritis ,   Abdominal (+) + obese,   Peds  Hematology negative hematology ROS (+)   Anesthesia Other Findings   Reproductive/Obstetrics                            Anesthesia Physical Anesthesia Plan  ASA: II  Anesthesia Plan: Spinal   Post-op Pain Management:  Regional for Post-op pain   Induction:   PONV Risk Score and Plan: 1 and Treatment may vary due to age or medical condition and Propofol infusion  Airway Management Planned: Natural Airway and Simple Face Mask  Additional Equipment: None  Intra-op Plan:   Post-operative Plan:   Informed Consent: I have reviewed the patients History and Physical, chart, labs and discussed the procedure including the risks, benefits and alternatives for the proposed anesthesia with the patient or authorized representative who has indicated his/her understanding and acceptance.       Plan Discussed with: CRNA and Anesthesiologist  Anesthesia Plan Comments: (Labs reviewed, platelets acceptable. Discussed risks and benefits of spinal, including spinal/epidural hematoma, infection, failed block, and PDPH. Patient expressed understanding and  wished to proceed. )       Anesthesia Quick Evaluation

## 2018-11-16 ENCOUNTER — Ambulatory Visit (HOSPITAL_COMMUNITY)

## 2018-11-16 ENCOUNTER — Other Ambulatory Visit: Payer: Self-pay

## 2018-11-16 ENCOUNTER — Encounter (HOSPITAL_COMMUNITY): Payer: Self-pay | Admitting: *Deleted

## 2018-11-16 ENCOUNTER — Ambulatory Visit (HOSPITAL_COMMUNITY): Admitting: Physician Assistant

## 2018-11-16 ENCOUNTER — Ambulatory Visit (HOSPITAL_COMMUNITY): Admitting: Certified Registered Nurse Anesthetist

## 2018-11-16 ENCOUNTER — Ambulatory Visit (HOSPITAL_COMMUNITY)
Admission: RE | Admit: 2018-11-16 | Discharge: 2018-11-17 | Disposition: A | Discharge status: Home Health | Source: Ambulatory Visit | Attending: Orthopedic Surgery | Admitting: Orthopedic Surgery

## 2018-11-16 ENCOUNTER — Encounter (HOSPITAL_COMMUNITY)
Admission: RE | Disposition: A | Discharge status: Home Health | Payer: Self-pay | Source: Ambulatory Visit | Attending: Orthopedic Surgery

## 2018-11-16 DIAGNOSIS — M48062 Spinal stenosis, lumbar region with neurogenic claudication: Secondary | ICD-10-CM | POA: Insufficient documentation

## 2018-11-16 DIAGNOSIS — M25861 Other specified joint disorders, right knee: Secondary | ICD-10-CM | POA: Diagnosis not present

## 2018-11-16 DIAGNOSIS — Z01811 Encounter for preprocedural respiratory examination: Secondary | ICD-10-CM

## 2018-11-16 DIAGNOSIS — Z6837 Body mass index (BMI) 37.0-37.9, adult: Secondary | ICD-10-CM | POA: Diagnosis not present

## 2018-11-16 DIAGNOSIS — M25761 Osteophyte, right knee: Secondary | ICD-10-CM | POA: Insufficient documentation

## 2018-11-16 DIAGNOSIS — E669 Obesity, unspecified: Secondary | ICD-10-CM | POA: Insufficient documentation

## 2018-11-16 DIAGNOSIS — M8568 Other cyst of bone, other site: Secondary | ICD-10-CM | POA: Insufficient documentation

## 2018-11-16 DIAGNOSIS — M1711 Unilateral primary osteoarthritis, right knee: Secondary | ICD-10-CM | POA: Insufficient documentation

## 2018-11-16 HISTORY — PX: TOTAL KNEE ARTHROPLASTY: SHX125

## 2018-11-16 LAB — CBC WITH DIFFERENTIAL/PLATELET
Abs Immature Granulocytes: 0.01 10*3/uL (ref 0.00–0.07)
Basophils Absolute: 0.1 10*3/uL (ref 0.0–0.1)
Basophils Relative: 1 %
Eosinophils Absolute: 0.3 10*3/uL (ref 0.0–0.5)
Eosinophils Relative: 5 %
HCT: 44.8 % (ref 39.0–52.0)
Hemoglobin: 14.4 g/dL (ref 13.0–17.0)
Immature Granulocytes: 0 %
Lymphocytes Relative: 31 %
Lymphs Abs: 1.5 10*3/uL (ref 0.7–4.0)
MCH: 30.6 pg (ref 26.0–34.0)
MCHC: 32.1 g/dL (ref 30.0–36.0)
MCV: 95.3 fL (ref 80.0–100.0)
Monocytes Absolute: 0.7 10*3/uL (ref 0.1–1.0)
Monocytes Relative: 15 %
Neutro Abs: 2.3 10*3/uL (ref 1.7–7.7)
Neutrophils Relative %: 48 %
Platelets: 203 10*3/uL (ref 150–400)
RBC: 4.7 MIL/uL (ref 4.22–5.81)
RDW: 13.1 % (ref 11.5–15.5)
WBC: 4.9 10*3/uL (ref 4.0–10.5)
nRBC: 0 % (ref 0.0–0.2)

## 2018-11-16 LAB — COMPREHENSIVE METABOLIC PANEL
ALT: 21 U/L (ref 0–44)
AST: 24 U/L (ref 15–41)
Albumin: 3.7 g/dL (ref 3.5–5.0)
Alkaline Phosphatase: 93 U/L (ref 38–126)
Anion gap: 8 (ref 5–15)
BUN: 15 mg/dL (ref 8–23)
CO2: 23 mmol/L (ref 22–32)
Calcium: 8.9 mg/dL (ref 8.9–10.3)
Chloride: 109 mmol/L (ref 98–111)
Creatinine, Ser: 0.92 mg/dL (ref 0.61–1.24)
GFR calc Af Amer: >60 mL/min (ref >60)
GFR calc non Af Amer: >60 mL/min (ref >60)
Glucose, Bld: 102 mg/dL — ABNORMAL HIGH (ref 70–99)
Potassium: 3.7 mmol/L (ref 3.5–5.1)
Sodium: 140 mmol/L (ref 135–145)
Total Bilirubin: 0.6 mg/dL (ref 0.3–1.2)
Total Protein: 6.7 g/dL (ref 6.5–8.1)

## 2018-11-16 LAB — PROTIME-INR
INR: 1 (ref 0.8–1.2)
Prothrombin Time: 12.9 seconds (ref 11.4–15.2)

## 2018-11-16 LAB — APTT: aPTT: 20 seconds — ABNORMAL LOW (ref 24–36)

## 2018-11-16 SURGERY — ARTHROPLASTY, KNEE, TOTAL
Anesthesia: Spinal | Site: Knee | Laterality: Right

## 2018-11-16 MED ORDER — METHOCARBAMOL 500 MG PO TABS
500.0000 mg | ORAL_TABLET | Freq: Four times a day (QID) | ORAL | Status: DC | PRN
  Administered 2018-11-16 – 2018-11-17 (×3): 500 mg via ORAL
  Filled 2018-11-16 (×3): qty 1

## 2018-11-16 MED ORDER — CEFAZOLIN SODIUM-DEXTROSE 2-4 GM/100ML-% IV SOLN
2.0000 g | Freq: Four times a day (QID) | INTRAVENOUS | Status: AC
  Administered 2018-11-16 (×2): 2 g via INTRAVENOUS
  Filled 2018-11-16 (×2): qty 100

## 2018-11-16 MED ORDER — PROMETHAZINE HCL 25 MG/ML IJ SOLN: 6.2500 mg | INTRAMUSCULAR | Status: DC | PRN

## 2018-11-16 MED ORDER — BUPIVACAINE-EPINEPHRINE 0.5% -1:200000 IJ SOLN
INTRAMUSCULAR | Status: DC | PRN
  Administered 2018-11-16: 30 mL

## 2018-11-16 MED ORDER — ACETAMINOPHEN 325 MG PO TABS
325.0000 mg | ORAL_TABLET | Freq: Four times a day (QID) | ORAL | Status: DC | PRN
  Filled 2018-11-16: qty 2

## 2018-11-16 MED ORDER — DOCUSATE SODIUM 100 MG PO CAPS: 100.0000 mg | ORAL_CAPSULE | Freq: Two times a day (BID) | ORAL | 0 refills | Status: DC

## 2018-11-16 MED ORDER — POVIDONE-IODINE 10 % EX SWAB
2.0000 "application " | Freq: Once | CUTANEOUS | Status: AC
  Administered 2018-11-16: 2 via TOPICAL

## 2018-11-16 MED ORDER — ALUM & MAG HYDROXIDE-SIMETH 200-200-20 MG/5ML PO SUSP: 30.0000 mL | ORAL | Status: DC | PRN

## 2018-11-16 MED ORDER — MIDAZOLAM HCL 5 MG/5ML IJ SOLN
INTRAMUSCULAR | Status: DC | PRN
  Administered 2018-11-16 (×2): 1 mg via INTRAVENOUS

## 2018-11-16 MED ORDER — MIDAZOLAM HCL 2 MG/2ML IJ SOLN
INTRAMUSCULAR | Status: AC
  Filled 2018-11-16: qty 2

## 2018-11-16 MED ORDER — BISACODYL 5 MG PO TBEC: 5.0000 mg | DELAYED_RELEASE_TABLET | Freq: Every day | ORAL | Status: DC | PRN

## 2018-11-16 MED ORDER — MAGNESIUM CITRATE PO SOLN: 1.0000 | Freq: Once | ORAL | Status: DC | PRN

## 2018-11-16 MED ORDER — ROPIVACAINE HCL 7.5 MG/ML IJ SOLN
INTRAMUSCULAR | Status: DC | PRN
  Administered 2018-11-16: 20 mL via PERINEURAL

## 2018-11-16 MED ORDER — 0.9 % SODIUM CHLORIDE (POUR BTL) OPTIME
TOPICAL | Status: DC | PRN
  Administered 2018-11-16: 08:00:00 1000 mL

## 2018-11-16 MED ORDER — PROPOFOL 500 MG/50ML IV EMUL
INTRAVENOUS | Status: DC | PRN
  Administered 2018-11-16: 50 ug/kg/min via INTRAVENOUS

## 2018-11-16 MED ORDER — WATER FOR IRRIGATION, STERILE IR SOLN
Status: DC | PRN
  Administered 2018-11-16 (×2): 1000 mL

## 2018-11-16 MED ORDER — CELECOXIB 200 MG PO CAPS
200.0000 mg | ORAL_CAPSULE | Freq: Two times a day (BID) | ORAL | Status: DC
  Administered 2018-11-16 – 2018-11-17 (×2): 200 mg via ORAL
  Filled 2018-11-16 (×2): qty 1

## 2018-11-16 MED ORDER — SODIUM CHLORIDE (PF) 0.9 % IJ SOLN
INTRAMUSCULAR | Status: AC
  Filled 2018-11-16: qty 50

## 2018-11-16 MED ORDER — BUPIVACAINE IN DEXTROSE 0.75-8.25 % IT SOLN
INTRATHECAL | Status: DC | PRN
  Administered 2018-11-16: 1.6 mL via INTRATHECAL

## 2018-11-16 MED ORDER — DIPHENHYDRAMINE HCL 12.5 MG/5ML PO ELIX: 12.5000 mg | ORAL_SOLUTION | ORAL | Status: DC | PRN

## 2018-11-16 MED ORDER — METHOCARBAMOL 500 MG IVPB - SIMPLE MED
500.0000 mg | Freq: Four times a day (QID) | INTRAVENOUS | Status: DC | PRN
  Filled 2018-11-16: qty 50

## 2018-11-16 MED ORDER — BUPIVACAINE-EPINEPHRINE (PF) 0.5% -1:200000 IJ SOLN
INTRAMUSCULAR | Status: AC
  Filled 2018-11-16: qty 30

## 2018-11-16 MED ORDER — SODIUM CHLORIDE 0.9% FLUSH
INTRAVENOUS | Status: DC | PRN
  Administered 2018-11-16: 50 mL

## 2018-11-16 MED ORDER — ASPIRIN EC 325 MG PO TBEC
325.0000 mg | DELAYED_RELEASE_TABLET | Freq: Two times a day (BID) | ORAL | Status: DC
  Administered 2018-11-16 – 2018-11-17 (×2): 325 mg via ORAL
  Filled 2018-11-16 (×2): qty 1

## 2018-11-16 MED ORDER — OXYCODONE HCL 5 MG/5ML PO SOLN: 5.0000 mg | Freq: Once | ORAL | Status: DC | PRN

## 2018-11-16 MED ORDER — FENTANYL CITRATE (PF) 100 MCG/2ML IJ SOLN
INTRAMUSCULAR | Status: AC
  Filled 2018-11-16: qty 2

## 2018-11-16 MED ORDER — ASPIRIN EC 325 MG PO TBEC: 325.0000 mg | DELAYED_RELEASE_TABLET | Freq: Two times a day (BID) | ORAL | 0 refills | Status: DC

## 2018-11-16 MED ORDER — CYCLOSPORINE 0.05 % OP EMUL
1.0000 [drp] | Freq: Two times a day (BID) | OPHTHALMIC | Status: DC
  Administered 2018-11-16 – 2018-11-17 (×3): 1 [drp] via OPHTHALMIC
  Filled 2018-11-16 (×4): qty 30

## 2018-11-16 MED ORDER — GABAPENTIN 300 MG PO CAPS
300.0000 mg | ORAL_CAPSULE | Freq: Two times a day (BID) | ORAL | Status: DC
  Administered 2018-11-16 – 2018-11-17 (×3): 300 mg via ORAL
  Filled 2018-11-16 (×3): qty 1

## 2018-11-16 MED ORDER — CHLORHEXIDINE GLUCONATE 4 % EX LIQD: 60.0000 mL | Freq: Once | CUTANEOUS | Status: DC

## 2018-11-16 MED ORDER — ONDANSETRON HCL 4 MG/2ML IJ SOLN
INTRAMUSCULAR | Status: DC | PRN
  Administered 2018-11-16: 4 mg via INTRAVENOUS

## 2018-11-16 MED ORDER — SODIUM CHLORIDE 0.9 % IV SOLN
INTRAVENOUS | Status: DC
  Administered 2018-11-16 (×2): via INTRAVENOUS

## 2018-11-16 MED ORDER — DEXAMETHASONE SODIUM PHOSPHATE 10 MG/ML IJ SOLN
INTRAMUSCULAR | Status: DC | PRN
  Administered 2018-11-16: 10 mg via INTRAVENOUS

## 2018-11-16 MED ORDER — OXYCODONE HCL 5 MG PO TABS
5.0000 mg | ORAL_TABLET | ORAL | Status: DC | PRN
  Administered 2018-11-16 – 2018-11-17 (×7): 5 mg via ORAL
  Filled 2018-11-16: qty 2
  Filled 2018-11-16 (×6): qty 1

## 2018-11-16 MED ORDER — TRANEXAMIC ACID-NACL 1000-0.7 MG/100ML-% IV SOLN
1000.0000 mg | INTRAVENOUS | Status: AC
  Administered 2018-11-16: 1000 mg via INTRAVENOUS
  Filled 2018-11-16: qty 100

## 2018-11-16 MED ORDER — TRANEXAMIC ACID-NACL 1000-0.7 MG/100ML-% IV SOLN
1000.0000 mg | Freq: Once | INTRAVENOUS | Status: AC
  Administered 2018-11-16: 1000 mg via INTRAVENOUS
  Filled 2018-11-16: qty 100

## 2018-11-16 MED ORDER — ONDANSETRON HCL 4 MG PO TABS: 4.0000 mg | ORAL_TABLET | Freq: Four times a day (QID) | ORAL | Status: DC | PRN

## 2018-11-16 MED ORDER — DOCUSATE SODIUM 100 MG PO CAPS
100.0000 mg | ORAL_CAPSULE | Freq: Two times a day (BID) | ORAL | Status: DC
  Administered 2018-11-16 – 2018-11-17 (×2): 100 mg via ORAL
  Filled 2018-11-16 (×2): qty 1

## 2018-11-16 MED ORDER — PHENYLEPHRINE 40 MCG/ML (10ML) SYRINGE FOR IV PUSH (FOR BLOOD PRESSURE SUPPORT)
PREFILLED_SYRINGE | INTRAVENOUS | Status: AC
  Filled 2018-11-16: qty 10

## 2018-11-16 MED ORDER — TIZANIDINE HCL 2 MG PO TABS: 2.0000 mg | ORAL_TABLET | Freq: Three times a day (TID) | ORAL | 0 refills | Status: DC | PRN

## 2018-11-16 MED ORDER — BUPIVACAINE LIPOSOME 1.3 % IJ SUSP
INTRAMUSCULAR | Status: DC | PRN
  Administered 2018-11-16: 20 mL

## 2018-11-16 MED ORDER — FENTANYL CITRATE (PF) 100 MCG/2ML IJ SOLN
INTRAMUSCULAR | Status: DC | PRN
  Administered 2018-11-16 (×2): 50 ug via INTRAVENOUS

## 2018-11-16 MED ORDER — LIDOCAINE 2% (20 MG/ML) 5 ML SYRINGE
INTRAMUSCULAR | Status: AC
  Filled 2018-11-16: qty 5

## 2018-11-16 MED ORDER — DEXAMETHASONE SODIUM PHOSPHATE 10 MG/ML IJ SOLN
INTRAMUSCULAR | Status: AC
  Filled 2018-11-16: qty 1

## 2018-11-16 MED ORDER — OXYCODONE HCL 5 MG PO TABS: 5.0000 mg | ORAL_TABLET | Freq: Once | ORAL | Status: DC | PRN

## 2018-11-16 MED ORDER — ONDANSETRON HCL 4 MG/2ML IJ SOLN: 4.0000 mg | Freq: Four times a day (QID) | INTRAMUSCULAR | Status: DC | PRN

## 2018-11-16 MED ORDER — LIDOCAINE 2% (20 MG/ML) 5 ML SYRINGE
INTRAMUSCULAR | Status: DC | PRN
  Administered 2018-11-16: 40 mg via INTRAVENOUS

## 2018-11-16 MED ORDER — POLYETHYLENE GLYCOL 3350 17 G PO PACK: 17.0000 g | PACK | Freq: Every day | ORAL | Status: DC | PRN

## 2018-11-16 MED ORDER — SODIUM CHLORIDE 0.9 % IR SOLN
Status: DC | PRN
  Administered 2018-11-16: 1000 mL

## 2018-11-16 MED ORDER — FENTANYL CITRATE (PF) 100 MCG/2ML IJ SOLN: 25.0000 ug | INTRAMUSCULAR | Status: DC | PRN

## 2018-11-16 MED ORDER — PHENYLEPHRINE 40 MCG/ML (10ML) SYRINGE FOR IV PUSH (FOR BLOOD PRESSURE SUPPORT)
PREFILLED_SYRINGE | INTRAVENOUS | Status: DC | PRN
  Administered 2018-11-16 (×8): 80 ug via INTRAVENOUS

## 2018-11-16 MED ORDER — OXYCODONE-ACETAMINOPHEN 5-325 MG PO TABS: 1.0000 | ORAL_TABLET | Freq: Four times a day (QID) | ORAL | 0 refills | Status: DC | PRN

## 2018-11-16 MED ORDER — LACTATED RINGERS IV SOLN
INTRAVENOUS | Status: DC
  Administered 2018-11-16 (×2): via INTRAVENOUS

## 2018-11-16 MED ORDER — HYDROMORPHONE HCL 1 MG/ML IJ SOLN: 0.5000 mg | INTRAMUSCULAR | Status: DC | PRN

## 2018-11-16 MED ORDER — PROPOFOL 10 MG/ML IV BOLUS
INTRAVENOUS | Status: DC | PRN
  Administered 2018-11-16: 20 mg via INTRAVENOUS
  Administered 2018-11-16: 10 mg via INTRAVENOUS
  Administered 2018-11-16 (×2): 20 mg via INTRAVENOUS

## 2018-11-16 MED ORDER — PROPOFOL 10 MG/ML IV BOLUS
INTRAVENOUS | Status: AC
  Filled 2018-11-16: qty 80

## 2018-11-16 MED ORDER — DEXAMETHASONE SODIUM PHOSPHATE 10 MG/ML IJ SOLN
10.0000 mg | Freq: Two times a day (BID) | INTRAMUSCULAR | Status: DC
  Administered 2018-11-17: 10 mg via INTRAVENOUS
  Filled 2018-11-16: qty 1

## 2018-11-16 MED ORDER — ONDANSETRON HCL 4 MG/2ML IJ SOLN
INTRAMUSCULAR | Status: AC
  Filled 2018-11-16: qty 2

## 2018-11-16 SURGICAL SUPPLY — 53 items
ATTUNE MED DOME PAT 41 KNEE (Knees) ×1 IMPLANT
ATTUNE PS FEM RT SZ 8 CEM KNEE (Femur) ×1 IMPLANT
ATTUNE PSRP INSR SZ8 7 KNEE (Insert) ×1 IMPLANT
BAG ZIPLOCK 12X15 (MISCELLANEOUS) ×2 IMPLANT
BASE TIBIAL ATTUNE KNEE SZ9 (Knees) IMPLANT
BENZOIN TINCTURE PRP APPL 2/3 (GAUZE/BANDAGES/DRESSINGS) ×2 IMPLANT
BLADE SAGITTAL 25.0X1.19X90 (BLADE) ×2 IMPLANT
BLADE SAW SGTL 13.0X1.19X90.0M (BLADE) ×2 IMPLANT
BLADE SURG SZ10 CARB STEEL (BLADE) ×4 IMPLANT
BNDG ELASTIC 6X5.8 VLCR STR LF (GAUZE/BANDAGES/DRESSINGS) ×2 IMPLANT
BOOTIES KNEE HIGH SLOAN (MISCELLANEOUS) ×2 IMPLANT
BOWL SMART MIX CTS (DISPOSABLE) ×2 IMPLANT
CEMENT HV SMART SET (Cement) ×4 IMPLANT
CLSR STERI-STRIP ANTIMIC 1/2X4 (GAUZE/BANDAGES/DRESSINGS) ×1 IMPLANT
COVER SURGICAL LIGHT HANDLE (MISCELLANEOUS) ×2 IMPLANT
COVER WAND RF STERILE (DRAPES) IMPLANT
CUFF TOURN SGL QUICK 34 (TOURNIQUET CUFF) ×1
CUFF TRNQT CYL 34X4.125X (TOURNIQUET CUFF) ×1 IMPLANT
DECANTER SPIKE VIAL GLASS SM (MISCELLANEOUS) ×4 IMPLANT
DRAPE U-SHAPE 47X51 STRL (DRAPES) ×2 IMPLANT
DRSG AQUACEL AG ADV 3.5X10 (GAUZE/BANDAGES/DRESSINGS) ×2 IMPLANT
DURAPREP 26ML APPLICATOR (WOUND CARE) ×2 IMPLANT
ELECT REM PT RETURN 15FT ADLT (MISCELLANEOUS) ×2 IMPLANT
GLOVE BIOGEL PI IND STRL 8 (GLOVE) ×2 IMPLANT
GLOVE BIOGEL PI INDICATOR 8 (GLOVE) ×2
GLOVE ECLIPSE 7.5 STRL STRAW (GLOVE) ×4 IMPLANT
GOWN STRL REUS W/TWL XL LVL3 (GOWN DISPOSABLE) ×4 IMPLANT
HANDPIECE INTERPULSE COAX TIP (DISPOSABLE) ×1
HOLDER FOLEY CATH W/STRAP (MISCELLANEOUS) IMPLANT
HOOD PEEL AWAY FLYTE STAYCOOL (MISCELLANEOUS) ×6 IMPLANT
KIT TURNOVER KIT A (KITS) IMPLANT
MANIFOLD NEPTUNE II (INSTRUMENTS) ×2 IMPLANT
NEEDLE HYPO 22GX1.5 SAFETY (NEEDLE) ×2 IMPLANT
NS IRRIG 1000ML POUR BTL (IV SOLUTION) ×2 IMPLANT
PACK ICE MAXI GEL EZY WRAP (MISCELLANEOUS) ×2 IMPLANT
PACK TOTAL KNEE CUSTOM (KITS) ×2 IMPLANT
PADDING CAST COTTON 6X4 STRL (CAST SUPPLIES) ×2 IMPLANT
PIN STEINMAN FIXATION KNEE (PIN) ×1 IMPLANT
PIN THREADED HEADED SIGMA (PIN) ×1 IMPLANT
PROTECTOR NERVE ULNAR (MISCELLANEOUS) ×2 IMPLANT
SET HNDPC FAN SPRY TIP SCT (DISPOSABLE) ×1 IMPLANT
STRIP CLOSURE SKIN 1/2X4 (GAUZE/BANDAGES/DRESSINGS) IMPLANT
SUT MNCRL AB 3-0 PS2 18 (SUTURE) ×2 IMPLANT
SUT VIC AB 0 CT1 36 (SUTURE) ×2 IMPLANT
SUT VIC AB 1 CT1 36 (SUTURE) ×4 IMPLANT
SUT VIC AB 2-0 CT1 27 (SUTURE) ×1
SUT VIC AB 2-0 CT1 TAPERPNT 27 (SUTURE) IMPLANT
SYR CONTROL 10ML LL (SYRINGE) ×4 IMPLANT
TIBIAL BASE ATTUNE KNEE SZ9 (Knees) ×2 IMPLANT
TIBIAL BASE ROT PLAT SZ 9 KNEE (Miscellaneous) ×1 IMPLANT
TRAY FOLEY MTR SLVR 16FR STAT (SET/KITS/TRAYS/PACK) ×2 IMPLANT
WATER STERILE IRR 1000ML POUR (IV SOLUTION) ×4 IMPLANT
YANKAUER SUCT BULB TIP 10FT TU (MISCELLANEOUS) ×2 IMPLANT

## 2018-11-16 NOTE — Plan of Care (Signed)

## 2018-11-16 NOTE — Anesthesia Procedure Notes (Signed)
Anesthesia Regional Block: Adductor canal block   Pre-Anesthetic Checklist: ,, timeout performed, Correct Patient, Correct Site, Correct Laterality, Correct Procedure, Correct Position, site marked, Risks and benefits discussed,  Surgical consent,  Pre-op evaluation,  At surgeon's request and post-op pain management  Laterality: Right  Prep: chloraprep       Needles:  Injection technique: Single-shot  Needle Type: Echogenic Needle     Needle Length: 10cm  Needle Gauge: 21     Additional Needles:   Narrative:  Start time: 11/16/2018 7:00 AM End time: 11/16/2018 7:04 AM Injection made incrementally with aspirations every 5 mL.  Performed by: Personally  Anesthesiologist: Audry Pili, MD  Additional Notes: No pain on injection. No increased resistance to injection. Injection made in 5cc increments. Good needle visualization. Patient tolerated the procedure well.

## 2018-11-16 NOTE — Transfer of Care (Signed)
Immediate Anesthesia Transfer of Care Note  Patient: Patrick Hill  Procedure(s) Performed: TOTAL KNEE ARTHROPLASTY (Right Knee)  Patient Location : PACU  Anesthesia Type:Spinal  Level of Consciousness: awake, alert  and oriented  Airway & Oxygen Therapy: Patient Spontanous Breathing and Patient connected to face mask oxygen  Post-op Assessment: Report given to RN and Post -op Vital signs reviewed and stable  Post vital signs: Reviewed and stable  Last Vitals:  Vitals Value Taken Time  BP    Temp    Pulse 81 11/16/18 0929  Resp 17 11/16/18 0929  SpO2 100 % 11/16/18 0929  Vitals shown include unvalidated device data.  Last Pain:  Vitals:   11/16/18 0627  TempSrc: Oral         Complications: No apparent anesthesia complications

## 2018-11-16 NOTE — Anesthesia Procedure Notes (Signed)
Spinal  Patient location during procedure: OR Start time: 11/16/2018 7:42 AM End time: 11/16/2018 7:45 AM Staffing Anesthesiologist: Audry Pili, MD Performed: anesthesiologist  Preanesthetic Checklist Completed: patient identified, surgical consent, pre-op evaluation, timeout performed, IV checked, risks and benefits discussed and monitors and equipment checked Spinal Block Patient position: sitting Prep: DuraPrep Patient monitoring: heart rate, cardiac monitor, continuous pulse ox and blood pressure Approach: midline Location: L2-3 Injection technique: single-shot Needle Needle type: Pencan  Needle gauge: 24 G Additional Notes Consent was obtained prior to the procedure with all questions answered and concerns addressed. Risks including, but not limited to, bleeding, infection, nerve damage, paralysis, failed block, inadequate analgesia, allergic reaction, high spinal, itching, and headache were discussed and the patient wished to proceed. Functioning IV was confirmed and monitors were applied. Sterile prep and drape, including hand hygiene, mask, and sterile gloves were used. The patient was positioned and the spine was prepped. The skin was anesthetized with lidocaine. Free flow of clear CSF was obtained prior to injecting local anesthetic into the CSF. The spinal needle aspirated freely following injection. The needle was carefully withdrawn. The patient tolerated the procedure well.   Renold Don, MD

## 2018-11-16 NOTE — H&P (Signed)
TOTAL KNEE ADMISSION H&P  Patient is being admitted for right total knee arthroplasty.  Subjective:  Chief Complaint:right knee pain.  HPI: Patrick Hill, 63 y.o. male, has a history of pain and functional disability in the right knee due to arthritis and has failed non-surgical conservative treatments for greater than 12 weeks to includeNSAID's and/or analgesics, corticosteriod injections, viscosupplementation injections, weight reduction as appropriate and activity modification.  Onset of symptoms was gradual, starting 5 years ago with gradually worsening course since that time. The patient noted prior procedures on the knee to include  arthroscopy and menisectomy on the right knee(s).  Patient currently rates pain in the right knee(s) at 9 out of 10 with activity. Patient has night pain, worsening of pain with activity and weight bearing, pain that interferes with activities of daily living, pain with passive range of motion and joint swelling.  Patient has evidence of subchondral cysts, subchondral sclerosis, periarticular osteophytes and joint space narrowing by imaging studies. This patient has had Failure of all reasonable conservative care. There is no active infection.  Patient Active Problem List   Diagnosis Date Noted  . Lumbar stenosis with neurogenic claudication 11/09/2016   Past Medical History:  Diagnosis Date  . Medical history non-contributory   . No pertinent past medical history     Past Surgical History:  Procedure Laterality Date  . BACK SURGERY  08/13/2016  . KNEE ARTHROSCOPY Right 07/27/2012   Procedure: ARTHROSCOPY KNEE, CHONDROPLASTY MEDIAL COMPARTMENT, MEDIAL PATELLA COMPARTMENT, EXCISION MEDIAL PLICA ;  Surgeon: Harvie JuniorJohn L Whitman Meinhardt, MD;  Location: [facility];  Service: Orthopedics;  Laterality: Right;  . NO PAST SURGERIES    . ORIF WRIST FRACTURE  11/11/2011   Procedure: OPEN REDUCTION INTERNAL FIXATION (ORIF) WRIST FRACTURE;  Surgeon: Harvie JuniorJohn L Glennon Kopko, MD;   Location: [facility];  Service: Orthopedics;  Laterality: Right;  orif right wrist    Current Facility-Administered Medications  Medication Dose Route Frequency Provider Last Rate Last Dose  . bupivacaine liposome (EXPAREL) 1.3 % injection 266 mg  20 mL Other Once Jodi GeraldsGraves, Amisha Pospisil, MD      . ceFAZolin (ANCEF) 3 g in dextrose 5 % 50 mL IVPB  3 g Intravenous On Call to OR Jodi GeraldsGraves, Elishua Radford, MD      . chlorhexidine (HIBICLENS) 4 % liquid 4 application  60 mL Topical Once Jodi GeraldsGraves, Kolson Chovanec, MD      . lactated ringers infusion   Intravenous Continuous Beryle LatheBrock, Thomas E, MD 50 mL/hr at 11/16/18 [phone]    . tranexamic acid (CYKLOKAPRON) IVPB 1,000 mg  1,000 mg Intravenous To OR Jodi GeraldsGraves, Keshawn Sundberg, MD       Facility-Administered Medications Ordered in Other Encounters  Medication Dose Route Frequency Provider Last Rate Last Dose  . fentaNYL (SUBLIMAZE) injection    Anesthesia Intra-op Jhonnie GarnerMarshall, Beth M, CRNA   50 mcg at 11/16/18 0700  . midazolam (VERSED) 5 MG/5ML injection    Anesthesia Intra-op Jhonnie GarnerMarshall, Beth M, CRNA   1 mg at 11/16/18 0700  . ropivacaine (PF) 7.5 mg/mL (0.75%) (NAROPIN) injection    Anesthesia Intra-op Beryle LatheBrock, Thomas E, MD   20 mL at 11/16/18 0704   No Known Allergies  Social History   Tobacco Use  . Smoking status: Never Smoker  . Smokeless tobacco: Never Used  Substance Use Topics  . Alcohol use: Yes    Comment: occ    History reviewed. No pertinent family history.   ROS ROS: I have reviewed the patient's review of systems thoroughly and  there are no positive responses as relates to the HPI. Objective:  Physical Exam  Vital signs in last 24 hours: Temp:  [98 F (36.7 C)] 98 F (36.7 C) (09/04 0627) Pulse Rate:  [66] 66 (09/04 0627) Resp:  [16] 16 (09/04 0627) BP: (109)/(85) 109/85 (09/04 0627) SpO2:  [98 %] 98 % (09/04 0627) Well-developed well-nourished patient in no acute distress. Alert and oriented x3 HEENT:within normal limits Cardiac: Regular rate and  rhythm Pulmonary: Lungs clear to auscultation Abdomen: Soft and nontender.  Normal active bowel sounds  Musculoskeletal: Right knee: Painful range of motion.  Limited range of motion.  No instability.  Trace effusion.  Neurovascular intact distally.  Labs: Recent Results (from the past 2160 hour(s))  Surgical pcr screen     Status: None   Collection Time: 11/05/18  9:20 AM   Specimen: Nasal Mucosa; Nasal Swab  Result Value Ref Range   MRSA, PCR NEGATIVE NEGATIVE   Staphylococcus aureus NEGATIVE NEGATIVE    Comment: (NOTE) The Xpert SA Assay (FDA approved for NASAL specimens in patients 39 years of age and older), is one component of a comprehensive surveillance program. It is not intended to diagnose infection nor to guide or monitor treatment. Performed at [facility], [address] [place], [place] [address]   CBC     Status: None   Collection Time: 11/05/18 10:00 AM  Result Value Ref Range   WBC 4.4 4.0 - 10.5 K/uL   RBC 4.75 4.22 - 5.81 MIL/uL   Hemoglobin 14.6 13.0 - 17.0 g/dL   HCT 38.2 50.5 - 39.7 %   MCV 95.6 80.0 - 100.0 fL   MCH 30.7 26.0 - 34.0 pg   MCHC 32.2 30.0 - 36.0 g/dL   RDW 67.3 41.9 - 37.9 %   Platelets 218 150 - 400 K/uL   nRBC 0.0 0.0 - 0.2 %    Comment: Performed at [facility] [address] [place], [place] [address]  SARS CORONAVIRUS 2 (TAT 6-24 HRS) Nasopharyngeal Nasopharyngeal Swab     Status: None   Collection Time: 11/13/18 10:30 AM   Specimen: Nasopharyngeal Swab  Result Value Ref Range   SARS Coronavirus 2 NEGATIVE NEGATIVE    Comment: (NOTE) SARS-CoV-2 target nucleic acids are NOT DETECTED. The SARS-CoV-2 RNA is generally detectable in upper and lower respiratory specimens during the acute phase of infection. Negative results do not preclude SARS-CoV-2 infection, do not rule out co-infections with other pathogens, and should not be used as the sole basis for treatment or other patient  management decisions. Negative results must be combined with clinical observations, patient history, and epidemiological information. The expected result is Negative. Fact Sheet for Patients: HairSlick.no Fact Sheet for Healthcare Providers: quierodirigir.com This test is not yet approved or cleared by the [place] FDA and  has been authorized for detection and/or diagnosis of SARS-CoV-2 by FDA under an Emergency Use Authorization (EUA). This EUA will remain  in effect (meaning this test can be used) for the duration of the COVID-19 declaration under Section 56 4(b)(1) of the Act, 21 U.S.C. section 360bbb-3(b)(1), unless the authorization is terminated or revoked sooner. Performed at [facility], [address] [place], [place] [address]   APTT     Status: Abnormal   Collection Time: 11/16/18  6:03 AM  Result Value Ref Range   aPTT 20 (L) 24 - 36 seconds    Comment: Performed at [facility], [address] [place], [place] [address]  CBC WITH DIFFERENTIAL     Status: None   Collection Time: 11/16/18  6:03 AM  Result Value Ref Range   WBC 4.9 4.0 - 10.5 K/uL   RBC 4.70 4.22 - 5.81 MIL/uL   Hemoglobin 14.4 13.0 - 17.0 g/dL   HCT 60.444.8 54.039.0 - 98.152.0 %   MCV 95.3 80.0 - 100.0 fL   MCH 30.6 26.0 - 34.0 pg   MCHC 32.1 30.0 - 36.0 g/dL   RDW 19.113.1 47.811.5 - 29.515.5 %   Platelets 203 150 - 400 K/uL   nRBC 0.0 0.0 - 0.2 %   Neutrophils Relative % 48 %   Neutro Abs 2.3 1.7 - 7.7 K/uL   Lymphocytes Relative 31 %   Lymphs Abs 1.5 0.7 - 4.0 K/uL   Monocytes Relative 15 %   Monocytes Absolute 0.7 0.1 - 1.0 K/uL   Eosinophils Relative 5 %   Eosinophils Absolute 0.3 0.0 - 0.5 K/uL   Basophils Relative 1 %   Basophils Absolute 0.1 0.0 - 0.1 K/uL   Immature Granulocytes 0 %   Abs Immature Granulocytes 0.01 0.00 - 0.07 K/uL    Comment: Performed at [facility], [address]  [place], [place] [address]  Comprehensive metabolic panel     Status: Abnormal   Collection Time: 11/16/18  6:03 AM  Result Value Ref Range   Sodium 140 135 - 145 mmol/L   Potassium 3.7 3.5 - 5.1 mmol/L   Chloride 109 98 - 111 mmol/L   CO2 23 22 - 32 mmol/L   Glucose, Bld 102 (H) 70 - 99 mg/dL   BUN 15 8 - 23 mg/dL   Creatinine, Ser 8.650.92 0.61 - 1.24 mg/dL   Calcium 8.9 8.9 - 78.410.3 mg/dL   Total Protein 6.7 6.5 - 8.1 g/dL   Albumin 3.7 3.5 - 5.0 g/dL   AST 24 15 - 41 U/L   ALT 21 0 - 44 U/L   Alkaline Phosphatase 93 38 - 126 U/L   Total Bilirubin 0.6 0.3 - 1.2 mg/dL   GFR calc non Af Amer >60 >60 mL/min   GFR calc Af Amer >60 >60 mL/min   Anion gap 8 5 - 15    Comment: Performed at [facility], [address] [place], [place] [address]  Protime-INR     Status: None   Collection Time: 11/16/18  6:03 AM  Result Value Ref Range   Prothrombin Time 12.9 11.4 - 15.2 seconds   INR 1.0 0.8 - 1.2    Comment: (NOTE) INR goal varies based on device and disease states. Performed at [facility], [address] [place], [place] [address]     Estimated body mass index is 37.49 kg/m as calculated from the following:   Height as of 11/05/18: 6' (1.829 m).   Weight as of 11/05/18: 125.4 kg.   Imaging Review Plain radiographs demonstrate severe degenerative joint disease of the right knee(s). The overall alignment ismild varus. The bone quality appears to be good for age and reported activity level.      Assessment/Plan:  End stage arthritis, right knee   The patient history, physical examination, clinical judgment of the provider and imaging studies are consistent with end stage degenerative joint disease of the right knee(s) and total knee arthroplasty is deemed medically necessary. The treatment options including medical management, injection therapy arthroscopy and arthroplasty were discussed at length. The risks and benefits of total  knee  arthroplasty were presented and reviewed. The risks due to aseptic loosening, infection, stiffness, patella tracking problems, thromboembolic complications and other imponderables were discussed. The patient acknowledged the explanation, agreed to proceed with the plan and consent was signed. Patient is being admitted for inpatient treatment for surgery, pain control, PT, OT, prophylactic antibiotics, VTE prophylaxis, progressive ambulation and ADL's and discharge planning. The patient is planning to be discharged home with home health services     Patient's anticipated LOS is less than 2 midnights, meeting these requirements: - Younger than 37 - Lives within 1 hour of care - Has a competent adult at home to recover with post-op recover - NO history of  - Chronic pain requiring opiods  - Diabetes  - Coronary Artery Disease  - Heart failure  - Heart attack  - Stroke  - DVT/VTE  - Cardiac arrhythmia  - Respiratory Failure/COPD  - Renal failure  - Anemia  - Advanced Liver disease

## 2018-11-16 NOTE — Evaluation (Signed)
Physical Therapy Evaluation Patient Details Name: Patrick Hill MRN: [id number] DOB: September 21, 1955 Today's Date: 11/16/2018   History of Present Illness  Pt s/p R TKR and with hx of back surgery x2  Clinical Impression  Pt s/p R TKR and presents with decreased R LE strength/ROM and post op pain limiting functional mobility.  Pt should progress to dc home with family assist.    Follow Up Recommendations Follow surgeon's recommendation for DC plan and follow-up therapies    Equipment Recommendations  None recommended by PT    Recommendations for Other Services       Precautions / Restrictions Precautions Precautions: Knee Restrictions Weight Bearing Restrictions: No Other Position/Activity Restrictions: WBAT      Mobility  Bed Mobility Overal bed mobility: Needs Assistance Bed Mobility: Supine to Sit     Supine to sit: Min assist     General bed mobility comments: cues for sequence, increased effort, use of bedrails, cues for sequence and use of L LE to self assist  Transfers Overall transfer level: Needs assistance Equipment used: Rolling walker (2 wheeled) Transfers: Sit to/from Stand Sit to Stand: Min assist         General transfer comment: cues for LE management and use of UEs to self assist  Ambulation/Gait Ambulation/Gait assistance: Min assist Gait Distance (Feet): 52 Feet Assistive device: Rolling walker (2 wheeled) Gait Pattern/deviations: Step-to pattern;Decreased step length - right;Decreased step length - left;Shuffle;Trunk flexed Gait velocity: decr   General Gait Details: cues for sequence, posture and position from [employer]  Stairs            Wheelchair Mobility    Modified Rankin (Stroke Patients Only)       Balance Overall balance assessment: Mild deficits observed, not formally tested                                           Pertinent Vitals/Pain Pain Assessment: 0-10 Pain Score: 5  Pain Location: R knee Pain  Descriptors / Indicators: Aching;Sore Pain Intervention(s): Limited activity within patient's tolerance;Monitored during session;Premedicated before session;Ice applied    Home Living Family/patient expects to be discharged to:: Private residence Living Arrangements: Spouse/significant other Available Help at Discharge: Family Type of Home: House Home Access: Stairs to enter Entrance Stairs-Rails: Right;Left;Can reach both Entrance Stairs-Number of Steps: 2 Home Layout: One level Home Equipment: Environmental consultantWalker - 2 wheels;Bedside commode;Shower seat      Prior Function Level of Independence: Independent         Comments: Works for [employer]     Hand Dominance        Extremity/Trunk Assessment   Upper Extremity Assessment Upper Extremity Assessment: Overall WFL for tasks assessed    Lower Extremity Assessment Lower Extremity Assessment: RLE deficits/detail    Cervical / Trunk Assessment Cervical / Trunk Assessment: Normal  Communication   Communication: No difficulties  Cognition Arousal/Alertness: Awake/alert Behavior During Therapy: WFL for tasks assessed/performed Overall Cognitive Status: Within Functional Limits for tasks assessed                                        General Comments      Exercises Total Joint Exercises Ankle Circles/Pumps: AROM;Both;15 reps;Supine   Assessment/Plan    PT Assessment Patient needs continued PT services  PT Problem List  Decreased strength;Decreased range of motion;Decreased activity tolerance;Decreased mobility;Decreased knowledge of use of DME;Pain;Obesity       PT Treatment Interventions DME instruction;Gait training;Stair training;Functional mobility training;Therapeutic activities;Therapeutic exercise;Patient/family education    PT Goals (Current goals can be found in the Care Plan section)  Acute Rehab PT Goals Patient Stated Goal: Regain IND PT Goal Formulation: With patient Time For Goal Achievement:  11/23/18 Potential to Achieve Goals: Good    Frequency 7X/week   Barriers to discharge        Co-evaluation               AM-PAC PT "6 Clicks" Mobility  Outcome Measure Help needed turning from your back to your side while in a flat bed without using bedrails?: A Little Help needed moving from lying on your back to sitting on the side of a flat bed without using bedrails?: A Little Help needed moving to and from a bed to a chair (including a wheelchair)?: A Little Help needed standing up from a chair using your arms (e.g., wheelchair or bedside chair)?: A Little Help needed to walk in hospital room?: A Little Help needed climbing 3-5 steps with a railing? : A Lot 6 Click Score: 17    End of Session Equipment Utilized During Treatment: Gait belt Activity Tolerance: Patient tolerated treatment well Patient left: in chair;with call bell/phone within reach;with chair alarm set Nurse Communication: Mobility status PT Visit Diagnosis: Difficulty in walking, not elsewhere classified (R26.2)    Time: 5681-2751 PT Time Calculation (min) (ACUTE ONLY): 23 min   Charges:   PT Evaluation $PT Eval Low Complexity: 1 Low PT Treatments $Gait Training: 8-22 mins        [place] Pager [phone] Office [phone]   Patrick Hill 11/16/2018, 5:57 PM

## 2018-11-16 NOTE — Discharge Instructions (Signed)

## 2018-11-16 NOTE — Anesthesia Postprocedure Evaluation (Signed)
Anesthesia Post Note  Patient: Patrick Hill  Procedure(s) Performed: TOTAL KNEE ARTHROPLASTY (Right Knee)     Patient location during evaluation: PACU Anesthesia Type: Spinal Level of consciousness: awake and alert Pain management: pain level controlled Vital Signs Assessment: post-procedure vital signs reviewed and stable Respiratory status: spontaneous breathing and respiratory function stable Cardiovascular status: blood pressure returned to baseline and stable Postop Assessment: spinal receding and no apparent nausea or vomiting Anesthetic complications: no    Last Vitals:  Vitals:   11/16/18 1000 11/16/18 1015  BP: 116/78 117/73  Pulse: 82 82  Resp: 15 13  Temp:  36.4 C  SpO2: 97% 96%    Last Pain:  Vitals:   11/16/18 1015  TempSrc:   PainSc: 0-No pain                 Audry Pili

## 2018-11-16 NOTE — Op Note (Addendum)
PATIENT ID:      Patrick Hill  MRN:     [id number] DOB/AGE:    09/08/1955 / 63 y.o.       OPERATIVE REPORT   DATE OF PROCEDURE:  11/16/2018      PREOPERATIVE DIAGNOSIS:   RIGHT KNEE OSTEOARTHRITIS      Estimated body mass index is 37.49 kg/m as calculated from the following:   Height as of 11/05/18: 6' (1.829 m).   Weight as of 11/05/18: 125.4 kg.                                                       POSTOPERATIVE DIAGNOSIS:   RIGHT KNEE OSTEOARTHRITIS                                                                       PROCEDURE:  Procedure(s): TOTAL KNEE ARTHROPLASTY Using DepuyAttune RP implants #8 Femur, #9Tibia, 7 mm Attune RP bearing, 41 Patella    SURGEON: Alta Corning  ASSISTANT:   Patrick bethune PA-C   (Present and scrubbed throughout the case, critical for assistance with exposure, retraction, instrumentation, and closure.)        ANESTHESIA: spinal, 20cc Exparel, 50cc 0.25% Marcaine EBL: 150 cc FLUID REPLACEMENT: unk cc crystaloid TOURNIQUET: DRAINS: None TRANEXAMIC ACID: 1gm IV, 2gm topical COMPLICATIONS:  None         INDICATIONS FOR PROCEDURE: The patient has  RIGHT KNEE OSTEOARTHRITIS, mild varus deformities, XR shows bone on bone arthritis, lateral subluxation of tibia. Patient has failed all conservative measures including anti-inflammatory medicines, narcotics, attempts at exercise and weight loss, cortisone injections and viscosupplementation.  Risks and benefits of surgery have been discussed, questions answered.   DESCRIPTION OF PROCEDURE: The patient identified by armband, received  IV antibiotics, in the holding area at [facility]. Patient taken to the operating room, appropriate anesthetic monitors were attached, and spinal anesthesia was  induced. IV Tranexamic acid was given.Tourniquet applied high to the operative thigh. Lateral post and foot positioner applied to the table, the lower extremity was then prepped and draped in usual sterile fashion from  the toes to the tourniquet. Time-out procedure was performed. The skin and subcutaneous tissue along the incision was injected with 20 cc of a mixture of Exparel and Marcaine solution, using a 20-gauge by 1-1/2 inch needle. We began the operation, with the knee flexed 130 degrees, by making the anterior midline incision starting at handbreadth above the patella going over the patella 1 cm medial to and 4 cm distal to the tibial tubercle. Small bleeders in the skin and the subcutaneous tissue identified and cauterized. Transverse retinaculum was incised and reflected medially and a medial parapatellar arthrotomy was accomplished. the patella was everted and theprepatellar fat pad resected. The superficial medial collateral ligament was then elevated from anterior to posterior along the proximal flare of the tibia and anterior half of the menisci resected. The knee was hyperflexed exposing bone on bone arthritis. Peripheral and notch osteophytes as well as the cruciate ligaments were then resected. We continued to work our way  around posteriorly along the proximal tibia, and externally rotated the tibia subluxing it out from underneath the femur. A McHale PCL retractor was placed through the notch and a lateral Hohmann retractor placed, and we then entered the proximal tibia in line with the Depuy starter drill in line with the axis of the tibia followed by an intramedullary guide rod and 0-degree posterior slope cutting guide. The tibial cutting guide, 4 degree posterior sloped, was pinned into place allowing resection of 3 mm of bone medially and 10 mm of bone laterally. Satisfied with the tibial resection, we then entered the distal femur 2 mm anterior to the PCL origin with the intramedullary guide rod and applied the distal femoral cutting guide set at 9 mm, with 5 degrees of valgus. This was pinned along the epicondylar axis. At this point, the distal femoral cut was accomplished without difficulty. We then  sized for a #8 femoral component and pinned the guide in 3 degrees of external rotation. The chamfer cutting guide was pinned into place. The anterior, posterior, and chamfer cuts were accomplished without difficulty followed by the Attune RP box cutting guide and the box cut. We also removed posterior osteophytes from the posterior femoral condyles. The posterior capsule was injected with Exparel solution. The knee was brought into full extension. We checked our extension gap and fit a 7 mm bearing. Distracting in extension with a lamina spreader,  bleeders in the posterior capsule, Posterior medial and posterior lateral gutter were cauterized.  The transexamic acid-soaked sponge was then placed in the gap of the knee in extension. The knee was flexed 30. The posterior patella cut was accomplished with the 9.5 mm Attune cutting guide, sized for a 33mm dome, and the fixation pegs drilled.The knee was then once again hyperflexed exposing the proximal tibia. We sized for a # 9 tibial base plate, applied the smokestack and the conical reamer followed by the the Delta fin keel punch. We then hammered into place the Attune RP trial femoral component, drilled the lugs, inserted a  7 mm trial bearing, trial patellar button, and took the knee through range of motion from 0-130 degrees. Medial and lateral ligamentous stability was checked. No thumb pressure was required for patellar Tracking. The tourniquet was @48  min. All trial components were removed, mating surfaces irrigated with pulse lavage, and dried with suction and sponges. 10 cc of the Exparel solution was applied to the cancellus bone of the patella distal femur and proximal tibia.  After waiting 30 seconds, the bony surfaces were again, dried with sponges. A double batch of DePuy HV cement was mixed and applied to all bony metallic mating surfaces except for the posterior condyles of the femur itself. In order, we hammered into place the tibial tray and removed  excess cement, the femoral component and removed excess cement. The final Attune RP bearing was inserted, and the knee brought to full extension with compression. The patellar button was clamped into place, and excess cement removed. The knee was held at 30 flexion with compression, while the cement cured. The wound was irrigated out with normal saline solution pulse lavage. The rest of the Exparel was injected into the parapatellar arthrotomy, subcutaneous tissues, and periosteal tissues. The parapatellar arthrotomy was closed with running #1 Vicryl suture. The subcutaneous tissue with 0 and 2-0 undyed Vicryl suture, and the skin with running 3-0 SQ vicryl. An Aquacil and Ace wrap were applied. The patient was taken to recovery room without difficulty.   Patrick Hill  Patrick Hill 11/16/2018, 9:07 AM

## 2018-11-17 DIAGNOSIS — M1711 Unilateral primary osteoarthritis, right knee: Secondary | ICD-10-CM | POA: Diagnosis not present

## 2018-11-17 LAB — CBC
HCT: 40.9 % (ref 39.0–52.0)
Hemoglobin: 13.2 g/dL (ref 13.0–17.0)
MCH: 30.6 pg (ref 26.0–34.0)
MCHC: 32.3 g/dL (ref 30.0–36.0)
MCV: 94.9 fL (ref 80.0–100.0)
Platelets: 197 10*3/uL (ref 150–400)
RBC: 4.31 MIL/uL (ref 4.22–5.81)
RDW: 12.9 % (ref 11.5–15.5)
WBC: 11.2 10*3/uL — ABNORMAL HIGH (ref 4.0–10.5)
nRBC: 0 % (ref 0.0–0.2)

## 2018-11-17 NOTE — Discharge Summary (Signed)
Patient ID: Patrick Hill MRN: [id number] DOB/AGE: 65-Dec-1957 65 y.o.  Admit date: 11/16/2018 Discharge date: 11/17/2018  Admission Diagnoses:  Principal Problem:   Primary osteoarthritis of right knee   Discharge Diagnoses:  Same  Past Medical History:  Diagnosis Date  . Medical history non-contributory   . No pertinent past medical history     Surgeries: Procedure(s): Right TOTAL KNEE ARTHROPLASTY on 11/16/2018  Discharged Condition: Improved  Hospital Course: Patrick Hill is an 63 y.o. male who was admitted 11/16/2018 for operative treatment ofPrimary osteoarthritis of right knee. Patient has severe unremitting pain that affects sleep, daily activities, and work/hobbies. After pre-op clearance the patient was taken to the operating room on 11/16/2018 and underwent  Procedure(s): Right TOTAL KNEE ARTHROPLASTY.    Patient was given perioperative antibiotics:  Anti-infectives (From admission, onward)   Start     Dose/Rate Route Frequency Ordered Stop   11/16/18 1400  ceFAZolin (ANCEF) IVPB 2g/100 mL premix     2 g 200 mL/hr over 30 Minutes Intravenous Every 6 hours 11/16/18 1144 11/16/18 2243   11/16/18 0600  ceFAZolin (ANCEF) 3 g in dextrose 5 % 50 mL IVPB     3 g 100 mL/hr over 30 Minutes Intravenous On call to O.R. 11/15/18 3329 11/16/18 0750       Patient was given sequential compression devices, early ambulation, and chemoprophylaxis to prevent DVT.  Patient benefited maximally from hospital stay and there were no complications.    Recent vital signs:  Patient Vitals for the past 24 hrs:  BP Temp Temp src Pulse Resp SpO2  11/17/18 0914 109/76 (!) 97.5 F (36.4 C) Oral 74 16 98 %  11/17/18 0537 104/67 - - 80 16 96 %  11/17/18 0048 112/74 97.9 F (36.6 C) Oral 79 16 95 %  11/16/18 2103 118/80 97.9 F (36.6 C) Oral 91 18 96 %  11/16/18 1449 117/81 - Oral 80 16 98 %  11/16/18 1345 118/85 - Oral 68 16 (!) 66 %  11/16/18 1244 126/78 (!) 97.5 F (36.4 C) Oral 65 16 100  %     Recent laboratory studies:  Recent Labs    11/16/18 0603 11/17/18 0234  WBC 4.9 11.2*  HGB 14.4 13.2  HCT 44.8 40.9  PLT 203 197  NA 140  --   K 3.7  --   CL 109  --   CO2 23  --   BUN 15  --   CREATININE 0.92  --   GLUCOSE 102*  --   INR 1.0  --   CALCIUM 8.9  --      Discharge Medications:   Allergies as of 11/17/2018   No Known Allergies     Medication List    STOP taking these medications   HYDROcodone-acetaminophen 5-325 MG tablet Commonly known as: NORCO/VICODIN     TAKE these medications   aspirin EC 325 MG tablet Take 1 tablet (325 mg total) by mouth 2 (two) times daily after a meal. Take x 1 month post op to decrease risk of blood clots.   cycloSPORINE 0.05 % ophthalmic emulsion Commonly known as: RESTASIS Place 1 drop into both eyes 2 (two) times daily.   docusate sodium 100 MG capsule Commonly known as: Colace Take 1 capsule (100 mg total) by mouth 2 (two) times daily.   oxyCODONE-acetaminophen 5-325 MG tablet Commonly known as: PERCOCET/ROXICET Take 1-2 tablets by mouth every 6 (six) hours as needed for severe pain.   tiZANidine 2 MG tablet Commonly  known as: ZANAFLEX Take 1 tablet (2 mg total) by mouth every 8 (eight) hours as needed for muscle spasms.            Discharge Care Instructions  (From admission, onward)         Start     Ordered   11/17/18 0000  Weight bearing as tolerated    Question Answer Comment  Laterality right   Extremity Lower      11/17/18 1221          Diagnostic Studies: Dg Chest 2 View  Result Date: 11/16/2018 CLINICAL DATA:  Preoperative evaluation for upcoming knee replacement EXAM: CHEST - 2 VIEW COMPARISON:  None. FINDINGS: Cardiac shadows within normal limits. The lungs are well aerated bilaterally. No focal infiltrate or sizable effusion is seen. Degenerative change of the thoracic spine is noted. IMPRESSION: No acute abnormality noted. Electronically Signed   By: Alcide CleverMark  Lukens M.D.   On:  11/16/2018 07:10    Disposition: Discharge disposition: 01-Home or Self Care       Discharge Instructions    CPM   Complete by: As directed    Continuous passive motion machine (CPM):      Use the CPM from 0 to 70 for 8 hours per day.      You may increase by 5-10 per day.  You may break it up into 2 or 3 sessions per day.      Use CPM for 1-2 weeks or until you are told to stop.   Call MD / Call 911   Complete by: As directed    If you experience chest pain or shortness of breath, CALL 911 and be transported to the hospital emergency room.  If you develope a fever above 101 F, pus (white drainage) or increased drainage or redness at the wound, or calf pain, call your surgeon's office.   Constipation Prevention   Complete by: As directed    Drink plenty of fluids.  Prune juice may be helpful.  You may use a stool softener, such as Colace (over the counter) 100 mg twice a day.  Use MiraLax (over the counter) for constipation as needed.   Diet general   Complete by: As directed    Do not put a pillow under the knee. Place it under the heel.   Complete by: As directed    Increase activity slowly as tolerated   Complete by: As directed    Weight bearing as tolerated   Complete by: As directed    Laterality: right   Extremity: Lower      Follow-up Information    Jodi GeraldsGraves, John, MD. Schedule an appointment as soon as possible for a visit in 2 weeks.   Specialty: Orthopedic Surgery Contact information: [address] [place] [address] [phone]            Signed: Matthew FolksJames G Kieth Hartis 11/17/2018, 12:21 PM

## 2018-11-17 NOTE — Plan of Care (Signed)
Patient discharged home in stable condition, waiting on his ride 

## 2018-11-17 NOTE — Progress Notes (Signed)
Subjective: 1 Day Post-Op Procedure(s) (LRB): TOTAL KNEE ARTHROPLASTY (Right) Patient reports pain as mild.  Taking by mouth and voiding okay.  Progressing with PT.  Wants to go home today.  Objective: Vital signs in last 24 hours: Temp:  [97.5 F (36.4 C)-97.9 F (36.6 C)] 97.5 F (36.4 C) (09/05 0914) Pulse Rate:  [65-91] 74 (09/05 0914) Resp:  [16-18] 16 (09/05 0914) BP: (104-126)/(67-85) 109/76 (09/05 0914) SpO2:  [66 %-100 %] 98 % (09/05 0914)  Intake/Output from previous day: 09/04 0701 - 09/05 0700 In: 4162.4 [P.O.:500; I.V.:3312.4; IV Piggyback:350] Out: 3700 [Urine:3550; Blood:150] Intake/Output this shift: Total I/O In: 120 [P.O.:120] Out: -   Recent Labs    11/16/18 0603 11/17/18 0234  HGB 14.4 13.2   Recent Labs    11/16/18 0603 11/17/18 0234  WBC 4.9 11.2*  RBC 4.70 4.31  HCT 44.8 40.9  PLT 203 197   Recent Labs    11/16/18 0603  NA 140  K 3.7  CL 109  CO2 23  BUN 15  CREATININE 0.92  GLUCOSE 102*  CALCIUM 8.9   Recent Labs    11/16/18 0603  INR 1.0   Right knee exam: Neurovascular intact Sensation intact distally Intact pulses distally Dorsiflexion/Plantar flexion intact Incision: dressing C/D/I Compartment soft   Assessment/Plan: 1 Day Post-Op Procedure(s) (LRB): TOTAL KNEE ARTHROPLASTY (Right) Plan: Weight-bear as tolerated on right lower extremity. Aspirin 325 mg twice daily x1 month postop for DVT prophylaxis. Discharge home today with home health PT. Up with therapy this morning. Follow-up with Dr. Berenice Primas in 2 weeks.    Patient's anticipated LOS is less than 2 midnights, meeting these requirements: - Younger than 35 - Lives within 1 hour of care - Has a competent adult at home to recover with post-op recover - NO history of  - Chronic pain requiring opiods  - Diabetes  - Coronary Artery Disease  - Heart failure  - Heart attack  - Stroke  - DVT/VTE  - Cardiac arrhythmia  - Respiratory Failure/COPD  - Renal  failure  - Anemia  - Advanced Liver disease       Erlene Senters 11/17/2018, 12:18 PM

## 2018-11-17 NOTE — TOC Initial Note (Signed)
Transition of Care [facility]) - Initial/Assessment Note    Patient Details  Name: Patrick Hill MRN: [id number] Date of Birth: Jul 10, 1955  Transition of Care (TOC) CM/SW Contact:    Joaquin Courts, RN Phone Number: 11/17/2018, 1:38 PM  Clinical Narrative:   CM spoke with patient at bedside. Patient reports he got a phone call from a [facility] agency this am but could not remember the name. Per patient agency is set up to come out on Monday to provide [facility] services. Patient reports he has rolling walker and 3-in-1 at home.                   Expected Discharge Plan: [place] Barriers to Discharge: No Barriers Identified   Patient Goals and CMS Choice Patient states their goals for this hospitalization and ongoing recovery are:: to go home      Expected Discharge Plan and Services Expected Discharge Plan: [place]   Discharge Planning Services: CM Consult   Living arrangements for the past 2 months: Single Family Home Expected Discharge Date: 11/17/18               DME Arranged: N/A DME Agency: NA       HH Arranged: PT [place] Agency: Other - See comment(per pt, agency was assigned by workers comp)        Prior Living Arrangements/Services Living arrangements for the past 2 months: [place] Lives with:: Spouse Patient language and need for interpreter reviewed:: Yes Do you feel safe going back to the place where you live?: Yes      Need for Family Participation in Patient Care: Yes (Comment) Care giver support system in place?: Yes (comment)   Criminal Activity/Legal Involvement Pertinent to Current Situation/Hospitalization: No - Comment as needed  Activities of Daily Living Home Assistive Devices/Equipment: Eyeglasses, Environmental consultant (specify type), Bedside commode/3-in-1 ADL Screening (condition at time of admission) Patient's cognitive ability adequate to safely complete daily activities?: Yes Is the patient deaf or have difficulty  hearing?: No Does the patient have difficulty seeing, even when wearing glasses/contacts?: No Does the patient have difficulty concentrating, remembering, or making decisions?: No Patient able to express need for assistance with ADLs?: Yes Does the patient have difficulty dressing or bathing?: No Independently performs ADLs?: Yes (appropriate for developmental age) Does the patient have difficulty walking or climbing stairs?: Yes Weakness of Legs: None Weakness of Arms/Hands: None  Permission Sought/Granted                  Emotional Assessment Appearance:: Appears stated age Attitude/Demeanor/Rapport: Engaged Affect (typically observed): Accepting Orientation: : Oriented to Place, Oriented to  Time, Oriented to Situation, Oriented to Self   Psych Involvement: No (comment)  Admission diagnosis:  RIGHT KNEE OSTEOARTHRITIS Patient Active Problem List   Diagnosis Date Noted  . Primary osteoarthritis of right knee 11/16/2018  . Lumbar stenosis with neurogenic claudication 11/09/2016   PCP:  Patient, No Pcp Per Pharmacy:   [facility] [place], [address] [place] [address] Phone: [phone] Fax: [phone]     Social Determinants of Health (SDOH) Interventions    Readmission Risk Interventions No flowsheet data found.

## 2018-11-17 NOTE — Progress Notes (Signed)
Physical Therapy Treatment Patient Details Name: Patrick Hill MRN: [id number] DOB: November 07, 1955 Today's Date: 11/17/2018    History of Present Illness Pt s/p R TKR and with hx of back surgery x2    PT Comments    Pt progressing well with mobility and hopeful for dc home this pm.   Follow Up Recommendations  Follow surgeon's recommendation for DC plan and follow-up therapies     Equipment Recommendations  None recommended by PT    Recommendations for Other Services       Precautions / Restrictions Precautions Precautions: Knee Restrictions Weight Bearing Restrictions: No Other Position/Activity Restrictions: WBAT    Mobility  Bed Mobility Overal bed mobility: Needs Assistance Bed Mobility: Supine to Sit     Supine to sit: Min guard     General bed mobility comments: cues for sequence, increased effort, use of bedrails, cues for sequence and use of L LE to self assist  Transfers Overall transfer level: Needs assistance Equipment used: Rolling walker (2 wheeled) Transfers: Sit to/from Stand Sit to Stand: Min guard         General transfer comment: cues for LE management and use of UEs to self assist  Ambulation/Gait Ambulation/Gait assistance: Min guard Gait Distance (Feet): 150 Feet Assistive device: Rolling walker (2 wheeled) Gait Pattern/deviations: Step-to pattern;Decreased step length - right;Decreased step length - left;Shuffle;Trunk flexed Gait velocity: decr   General Gait Details: cues for sequence, posture and position from [employer]             Wheelchair Mobility    Modified Rankin (Stroke Patients Only)       Balance Overall balance assessment: Mild deficits observed, not formally tested                                          Cognition Arousal/Alertness: Awake/alert Behavior During Therapy: WFL for tasks assessed/performed Overall Cognitive Status: Within Functional Limits for tasks assessed                                         Exercises Total Joint Exercises Ankle Circles/Pumps: AROM;Both;15 reps;Supine Quad Sets: AROM;Both;10 reps;Supine Heel Slides: AAROM;Right;15 reps;Supine Straight Leg Raises: AAROM;AROM;Right;15 reps;Supine    General Comments        Pertinent Vitals/Pain Pain Assessment: 0-10 Pain Score: 5  Pain Location: R knee Pain Descriptors / Indicators: Aching;Sore Pain Intervention(s): Limited activity within patient's tolerance;Monitored during session;Premedicated before session;Ice applied    Home Living                      Prior Function            PT Goals (current goals can now be found in the care plan section) Acute Rehab PT Goals Patient Stated Goal: Regain IND PT Goal Formulation: With patient Time For Goal Achievement: 11/23/18 Potential to Achieve Goals: Good Progress towards PT goals: Progressing toward goals    Frequency    7X/week      PT Plan Current plan remains appropriate    Co-evaluation              AM-PAC PT "6 Clicks" Mobility   Outcome Measure  Help needed turning from your back to your side while in a flat bed without using bedrails?:  A Little Help needed moving from lying on your back to sitting on the side of a flat bed without using bedrails?: A Little Help needed moving to and from a bed to a chair (including a wheelchair)?: A Little Help needed standing up from a chair using your arms (e.g., wheelchair or bedside chair)?: A Little Help needed to walk in hospital room?: A Little Help needed climbing 3-5 steps with a railing? : A Lot 6 Click Score: 17    End of Session Equipment Utilized During Treatment: Gait belt Activity Tolerance: Patient tolerated treatment well Patient left: in chair;with call bell/phone within reach;with chair alarm set Nurse Communication: Mobility status PT Visit Diagnosis: Difficulty in walking, not elsewhere classified (R26.2)     Time:  [id number] PT Time Calculation (min) (ACUTE ONLY): 28 min  Charges:  $Gait Training: 8-22 mins $Therapeutic Exercise: 8-22 mins                     Patrick KaufmannHunter Nijah Hill PT Acute Rehabilitation Services Pager [phone] Office [phone]    Patrick Hill 11/17/2018, 12:47 PM

## 2018-11-17 NOTE — Progress Notes (Signed)
Physical Therapy Treatment Patient Details Name: Patrick Hill MRN: [id number] DOB: [birth year] Today's Date: 11/17/2018    History of Present Illness Pt s/p R TKR and with hx of back surgery x2    PT Comments    Pt progressing well with mobility and eager for dc home.  Pt reviewed stairs and home therex program with written instruction provided.   Follow Up Recommendations  Follow surgeon's recommendation for DC plan and follow-up therapies     Equipment Recommendations  None recommended by PT    Recommendations for Other Services       Precautions / Restrictions Precautions Precautions: Knee Restrictions Weight Bearing Restrictions: No Other Position/Activity Restrictions: WBAT    Mobility  Bed Mobility               General bed mobility comments: Pt up in chair and requests back to same  Transfers Overall transfer level: Needs assistance Equipment used: Rolling walker (2 wheeled) Transfers: Sit to/from Stand Sit to Stand: Supervision         General transfer comment: cues for LE management and use of UEs to self assist  Ambulation/Gait Ambulation/Gait assistance: Min guard;Supervision Gait Distance (Feet): 100 Feet Assistive device: Rolling walker (2 wheeled) Gait Pattern/deviations: Step-to pattern;Decreased step length - right;Decreased step length - left;Shuffle;Trunk flexed Gait velocity: decr   General Gait Details: min cues for sequence, posture and position from RW   Stairs Stairs: Yes Stairs assistance: Min assist Stair Management: One rail Left;Step to pattern;Forwards Number of Stairs: 5 General stair comments: cues for sequence   Wheelchair Mobility    Modified Rankin (Stroke Patients Only)       Balance Overall balance assessment: Mild deficits observed, not formally tested                                          Cognition Arousal/Alertness: Awake/alert Behavior During Therapy: WFL for tasks  assessed/performed Overall Cognitive Status: Within Functional Limits for tasks assessed                                        Exercises Total Joint Exercises Ankle Circles/Pumps: AROM;Both;15 reps;Supine Quad Sets: AROM;Both;10 reps;Supine Heel Slides: AAROM;Right;15 reps;Supine Straight Leg Raises: AAROM;AROM;Right;15 reps;Supine Long Arc Quad: AAROM;AROM;Right;10 reps;Seated    General Comments        Pertinent Vitals/Pain Pain Assessment: 0-10 Pain Score: 5  Pain Location: R knee Pain Descriptors / Indicators: Aching;Sore Pain Intervention(s): Limited activity within patient's tolerance;Premedicated before session;Monitored during session;Ice applied    Home Living                      Prior Function            PT Goals (current goals can now be found in the care plan section) Acute Rehab PT Goals Patient Stated Goal: Regain IND PT Goal Formulation: With patient Time For Goal Achievement: 11/23/18 Potential to Achieve Goals: Good Progress towards PT goals: Progressing toward goals    Frequency    7X/week      PT Plan Current plan remains appropriate    Co-evaluation              AM-PAC PT "6 Clicks" Mobility   Outcome Measure  Help needed turning from your back to your  side while in a flat bed without using bedrails?: A Little Help needed moving from lying on your back to sitting on the side of a flat bed without using bedrails?: A Little Help needed moving to and from a bed to a chair (including a wheelchair)?: A Little Help needed standing up from a chair using your arms (e.g., wheelchair or bedside chair)?: A Little Help needed to walk in hospital room?: A Little Help needed climbing 3-5 steps with a railing? : A Little 6 Click Score: 18    End of Session Equipment Utilized During Treatment: Gait belt Activity Tolerance: Patient tolerated treatment well Patient left: in chair;with call bell/phone within reach;with  chair alarm set Nurse Communication: Mobility status PT Visit Diagnosis: Difficulty in walking, not elsewhere classified (R26.2)     Time: 1410-1440 PT Time Calculation (min) (ACUTE ONLY): 30 min  Charges:  $Gait Training: 8-22 mins $Therapeutic Exercise: 8-22 mins                     Mauro KaufmannHunter Loralye Loberg PT Acute Rehabilitation Services Pager [phone] Office [phone]    Gaston Dase 11/17/2018, 4:00 PM

## 2018-11-21 ENCOUNTER — Encounter (HOSPITAL_COMMUNITY): Payer: Self-pay | Admitting: Orthopedic Surgery

## 2019-05-28 DIAGNOSIS — M79671 Pain in right foot: Secondary | ICD-10-CM | POA: Diagnosis not present

## 2019-06-18 DIAGNOSIS — H04123 Dry eye syndrome of bilateral lacrimal glands: Secondary | ICD-10-CM | POA: Diagnosis not present

## 2019-06-21 DIAGNOSIS — M4326 Fusion of spine, lumbar region: Secondary | ICD-10-CM | POA: Diagnosis not present

## 2019-06-21 DIAGNOSIS — M4316 Spondylolisthesis, lumbar region: Secondary | ICD-10-CM | POA: Diagnosis not present

## 2019-06-21 DIAGNOSIS — G8929 Other chronic pain: Secondary | ICD-10-CM | POA: Diagnosis not present

## 2019-06-21 DIAGNOSIS — M533 Sacrococcygeal disorders, not elsewhere classified: Secondary | ICD-10-CM | POA: Diagnosis not present

## 2019-06-21 DIAGNOSIS — M4807 Spinal stenosis, lumbosacral region: Secondary | ICD-10-CM | POA: Diagnosis not present

## 2019-06-21 DIAGNOSIS — M545 Low back pain: Secondary | ICD-10-CM | POA: Diagnosis not present

## 2019-06-21 DIAGNOSIS — M47814 Spondylosis without myelopathy or radiculopathy, thoracic region: Secondary | ICD-10-CM | POA: Diagnosis not present

## 2019-06-21 DIAGNOSIS — M549 Dorsalgia, unspecified: Secondary | ICD-10-CM | POA: Diagnosis not present

## 2019-06-21 DIAGNOSIS — M5134 Other intervertebral disc degeneration, thoracic region: Secondary | ICD-10-CM | POA: Diagnosis not present

## 2019-06-28 DIAGNOSIS — M5127 Other intervertebral disc displacement, lumbosacral region: Secondary | ICD-10-CM | POA: Diagnosis not present

## 2019-06-28 DIAGNOSIS — M545 Low back pain: Secondary | ICD-10-CM | POA: Diagnosis not present

## 2019-06-28 DIAGNOSIS — M4807 Spinal stenosis, lumbosacral region: Secondary | ICD-10-CM | POA: Diagnosis not present

## 2019-06-28 DIAGNOSIS — M4316 Spondylolisthesis, lumbar region: Secondary | ICD-10-CM | POA: Diagnosis not present

## 2019-06-28 DIAGNOSIS — G8929 Other chronic pain: Secondary | ICD-10-CM | POA: Diagnosis not present

## 2019-06-28 DIAGNOSIS — M48061 Spinal stenosis, lumbar region without neurogenic claudication: Secondary | ICD-10-CM | POA: Diagnosis not present

## 2019-06-28 DIAGNOSIS — M47816 Spondylosis without myelopathy or radiculopathy, lumbar region: Secondary | ICD-10-CM | POA: Diagnosis not present

## 2019-07-08 DIAGNOSIS — M533 Sacrococcygeal disorders, not elsewhere classified: Secondary | ICD-10-CM | POA: Diagnosis not present

## 2019-08-01 DIAGNOSIS — E782 Mixed hyperlipidemia: Secondary | ICD-10-CM | POA: Diagnosis not present

## 2019-08-01 DIAGNOSIS — D509 Iron deficiency anemia, unspecified: Secondary | ICD-10-CM | POA: Diagnosis not present

## 2019-08-01 DIAGNOSIS — E039 Hypothyroidism, unspecified: Secondary | ICD-10-CM | POA: Diagnosis not present

## 2019-08-01 DIAGNOSIS — E113292 Type 2 diabetes mellitus with mild nonproliferative diabetic retinopathy without macular edema, left eye: Secondary | ICD-10-CM | POA: Diagnosis not present

## 2019-08-09 DIAGNOSIS — M5442 Lumbago with sciatica, left side: Secondary | ICD-10-CM | POA: Diagnosis not present

## 2019-08-09 DIAGNOSIS — M5441 Lumbago with sciatica, right side: Secondary | ICD-10-CM | POA: Diagnosis not present

## 2019-08-09 DIAGNOSIS — M48061 Spinal stenosis, lumbar region without neurogenic claudication: Secondary | ICD-10-CM | POA: Diagnosis not present

## 2019-08-09 DIAGNOSIS — M4807 Spinal stenosis, lumbosacral region: Secondary | ICD-10-CM | POA: Diagnosis not present

## 2019-08-09 DIAGNOSIS — G8929 Other chronic pain: Secondary | ICD-10-CM | POA: Diagnosis not present

## 2020-01-20 DIAGNOSIS — R21 Rash and other nonspecific skin eruption: Secondary | ICD-10-CM | POA: Diagnosis not present

## 2020-01-31 DIAGNOSIS — R21 Rash and other nonspecific skin eruption: Secondary | ICD-10-CM | POA: Diagnosis not present

## 2020-01-31 DIAGNOSIS — A303 Borderline leprosy: Secondary | ICD-10-CM | POA: Diagnosis not present

## 2020-02-03 DIAGNOSIS — E039 Hypothyroidism, unspecified: Secondary | ICD-10-CM | POA: Diagnosis not present

## 2020-02-03 DIAGNOSIS — E113292 Type 2 diabetes mellitus with mild nonproliferative diabetic retinopathy without macular edema, left eye: Secondary | ICD-10-CM | POA: Diagnosis not present

## 2020-02-03 DIAGNOSIS — E782 Mixed hyperlipidemia: Secondary | ICD-10-CM | POA: Diagnosis not present

## 2020-02-03 DIAGNOSIS — M810 Age-related osteoporosis without current pathological fracture: Secondary | ICD-10-CM | POA: Diagnosis not present

## 2020-02-21 DIAGNOSIS — A305 Lepromatous leprosy: Secondary | ICD-10-CM | POA: Diagnosis not present

## 2024-02-25 ENCOUNTER — Ambulatory Visit
Admission: RE | Admit: 2024-02-25 | Discharge: 2024-02-25 | Disposition: A | Discharge status: Home / Self Care | Source: Ambulatory Visit | Attending: Family Medicine | Admitting: Family Medicine

## 2024-02-25 ENCOUNTER — Telehealth: Payer: Self-pay

## 2024-02-25 ENCOUNTER — Other Ambulatory Visit: Payer: Self-pay

## 2024-02-25 VITALS — BP 123/86 | HR 102 | Temp 98.8°F | Resp 18 | Ht 72.0 in | Wt 250.0 lb

## 2024-02-25 DIAGNOSIS — K2900 Acute gastritis without bleeding: Secondary | ICD-10-CM | POA: Diagnosis not present

## 2024-02-25 DIAGNOSIS — R1013 Epigastric pain: Secondary | ICD-10-CM | POA: Diagnosis not present

## 2024-02-25 DIAGNOSIS — M542 Cervicalgia: Secondary | ICD-10-CM | POA: Diagnosis not present

## 2024-02-25 MED ORDER — LIDOCAINE VISCOUS HCL 2 % MT SOLN
15.0000 mL | Freq: Once | OROMUCOSAL | Status: AC
  Administered 2024-02-25: 15 mL via OROMUCOSAL

## 2024-02-25 MED ORDER — ALUM & MAG HYDROXIDE-SIMETH 200-200-20 MG/5ML PO SUSP
30.0000 mL | Freq: Once | ORAL | Status: AC
  Administered 2024-02-25: 30 mL via ORAL

## 2024-02-25 MED ORDER — ALUM & MAG HYDROXIDE-SIMETH 400-400-40 MG/5ML PO SUSP: 10.0000 mL | Freq: Four times a day (QID) | ORAL | 0 refills | Status: DC | PRN

## 2024-02-25 MED ORDER — OMEPRAZOLE 40 MG PO CPDR: 40.0000 mg | DELAYED_RELEASE_CAPSULE | Freq: Every day | ORAL | 0 refills | Status: AC

## 2024-02-25 MED ORDER — OMEPRAZOLE 40 MG PO CPDR: 40.0000 mg | DELAYED_RELEASE_CAPSULE | Freq: Every day | ORAL | 0 refills | Status: DC

## 2024-02-25 MED ORDER — ALUM & MAG HYDROXIDE-SIMETH 400-400-40 MG/5ML PO SUSP: 10.0000 mL | Freq: Four times a day (QID) | ORAL | 0 refills | Status: AC | PRN

## 2024-02-25 MED ORDER — TIZANIDINE HCL 4 MG PO TABS: 4.0000 mg | ORAL_TABLET | Freq: Four times a day (QID) | ORAL | 0 refills | Status: AC | PRN

## 2024-02-25 NOTE — Discharge Instructions (Signed)
 Take the omeprazole  once a day on an empty stomach This will reduce the acid in your stomach Take the liquid antiacid as needed for heartburn.  Take a dose at bedtime to help sleep Elevate the head of your bed Avoid ibuprofen, aspirin , naproxen. Follow-up if you fail to improve I have provided a mild muscle relaxer to take for your neck pain.  This is useful at bedtime May take Tylenol  as needed for pain

## 2024-02-25 NOTE — ED Provider Notes (Signed)
 [facility]    CSN: [id number] Arrival date & time: 02/25/24  9171      History   Chief Complaint Chief Complaint  Patient presents with   Chest Pain    HPI Patrick Hill is a 68 y.o. male.   HPI Patient states that he has a decreased appetite.  Some nausea.  Epigastric pain.  Heartburn.  He feels like he has stomach acid.  States he has not had this in the past.  He does not drink alcohol.  He does not take NSAID drugs.  He does eat a lot of spicy food consistent with his culture.  Denies increased stress. No cardiac history, hypertension, hyperlipidemia, or diabetes. Patient does not go to the doctor regularly, have checkups, or preventative medicine Patient has had a lot of orthopedic procedures and surgeries Past Medical History:  Diagnosis Date   Medical history non-contributory    No pertinent past medical history     Patient Active Problem List   Diagnosis Date Noted   Primary osteoarthritis of right knee 11/16/2018   Lumbar stenosis with neurogenic claudication 11/09/2016    Past Surgical History:  Procedure Laterality Date   BACK SURGERY  08/13/2016   KNEE ARTHROSCOPY Right 07/27/2012   Procedure: ARTHROSCOPY KNEE, CHONDROPLASTY MEDIAL COMPARTMENT, MEDIAL PATELLA COMPARTMENT, EXCISION MEDIAL PLICA ;  Surgeon: Norleen LITTIE Gavel, MD;  Location: [facility];  Service: Orthopedics;  Laterality: Right;   NO PAST SURGERIES     ORIF WRIST FRACTURE  11/11/2011   Procedure: OPEN REDUCTION INTERNAL FIXATION (ORIF) WRIST FRACTURE;  Surgeon: Norleen LITTIE Gavel, MD;  Location: [facility];  Service: Orthopedics;  Laterality: Right;  orif right wrist   TOTAL KNEE ARTHROPLASTY Right 11/16/2018   Procedure: TOTAL KNEE ARTHROPLASTY;  Surgeon: Gavel Norleen, MD;  Location: [facility];  Service: Orthopedics;  Laterality: Right;       Home Medications    Prior to Admission medications  Medication Sig Start Date End Date Taking? Authorizing Provider   tiZANidine  (ZANAFLEX ) 4 MG tablet Take 1-2 tablets (4-8 mg total) by mouth every 6 (six) hours as needed for muscle spasms. 02/25/24  Yes Maranda Jamee Jacob, MD  alum & mag hydroxide-simeth (MAALOX PLUS) 400-400-40 MG/5ML suspension Take 10 mLs by mouth every 6 (six) hours as needed for indigestion. 02/25/24   Maranda Jamee Jacob, MD  omeprazole  (PRILOSEC) 40 MG capsule Take 1 capsule (40 mg total) by mouth daily. 02/25/24   Maranda Jamee Jacob, MD    Family History History reviewed. No pertinent family history.  Social History Social History[1]   Allergies   Patient has no known allergies.   Review of Systems Review of Systems  See HPI Physical Exam Triage Vital Signs ED Triage Vitals  Encounter Vitals Group     BP 02/25/24 0851 123/86     Girls Systolic BP Percentile --      Girls Diastolic BP Percentile --      Boys Systolic BP Percentile --      Boys Diastolic BP Percentile --      Pulse Rate 02/25/24 0851 (!) 102     Resp 02/25/24 0851 18     Temp 02/25/24 0851 98.8 F (37.1 C)     Temp Source 02/25/24 0851 Oral     SpO2 02/25/24 0851 93 %     Weight 02/25/24 0849 250 lb (113.4 kg)     Height 02/25/24 0849 6' (1.829 m)     Head Circumference --  Peak Flow --      Pain Score 02/25/24 0849 8     Pain Loc --      Pain Education --      Exclude from Growth Chart --    No data found.  Updated Vital Signs BP 123/86 (BP Location: Right Arm)   Pulse (!) 102   Temp 98.8 F (37.1 C) (Oral)   Resp 18   Ht 6' (1.829 m)   Wt 113.4 kg   SpO2 93%   BMI 33.91 kg/m       Physical Exam Constitutional:      General: He is not in acute distress.    Appearance: He is well-developed.  HENT:     Head: Normocephalic and atraumatic.  Eyes:     Conjunctiva/sclera: Conjunctivae normal.     Pupils: Pupils are equal, round, and reactive to light.  Cardiovascular:     Rate and Rhythm: Normal rate and regular rhythm.     Heart sounds: Normal heart sounds.  Pulmonary:      Effort: Pulmonary effort is normal. No respiratory distress.     Breath sounds: Normal breath sounds.  Abdominal:     General: Bowel sounds are normal. There is no distension.     Palpations: Abdomen is soft. There is no hepatomegaly, splenomegaly or mass.     Tenderness: There is abdominal tenderness.     Comments: Tenderness to palpation throughout that is mild, most pronounced in the mid epigastrium  Musculoskeletal:        General: Normal range of motion.     Cervical back: Normal range of motion.  Skin:    General: Skin is warm and dry.  Neurological:     Mental Status: He is alert.      UC Treatments / Results  Labs (all labs ordered are listed, but only abnormal results are displayed) Labs Reviewed - No data to display  EKG   Radiology No results found.  Procedures Procedures (including critical care time)  Medications Ordered in UC Medications  alum & mag hydroxide-simeth (MAALOX/MYLANTA) 200-200-20 MG/5ML suspension 30 mL (30 mLs Oral Given 02/25/24 0920)  lidocaine  (XYLOCAINE ) 2 % viscous mouth solution 15 mL (15 mLs Mouth/Throat Given 02/25/24 0920)   Patient expresses improvement in pain after GI cocktail Initial Impression / Assessment and Plan / UC Course  I have reviewed the triage vital signs and the nursing notes.  Pertinent labs & imaging results that were available during my care of the patient were reviewed by me and considered in my medical decision making (see chart for details).     Final Clinical Impressions(s) / UC Diagnoses   Final diagnoses:  Abdominal pain, epigastric  Acute gastritis, presence of bleeding unspecified, unspecified gastritis type  Musculoskeletal neck pain     Discharge Instructions      Take the omeprazole  once a day on an empty stomach This will reduce the acid in your stomach Take the liquid antiacid as needed for heartburn.  Take a dose at bedtime to help sleep Elevate the head of your bed Avoid  ibuprofen, aspirin , naproxen. Follow-up if you fail to improve I have provided a mild muscle relaxer to take for your neck pain.  This is useful at bedtime May take Tylenol  as needed for pain   ED Prescriptions     Medication Sig Dispense Auth. Provider   omeprazole  (PRILOSEC) 40 MG capsule Take 1 capsule (40 mg total) by mouth daily. 30 capsule Maranda Jamee Jacob,  MD   alum & mag hydroxide-simeth (MAALOX PLUS) 400-400-40 MG/5ML suspension Take 10 mLs by mouth every 6 (six) hours as needed for indigestion. 355 mL Maranda Jamee Jacob, MD   tiZANidine  (ZANAFLEX ) 4 MG tablet Take 1-2 tablets (4-8 mg total) by mouth every 6 (six) hours as needed for muscle spasms. 21 tablet Maranda Jamee Jacob, MD      PDMP not reviewed this encounter.    [1]  Social History Tobacco Use   Smoking status: Never   Smokeless tobacco: Never  Vaping Use   Vaping status: Never Used  Substance Use Topics   Alcohol use: Yes    Comment: occ   Drug use: No     Maranda Jamee Jacob, MD 02/25/24 1220

## 2024-02-25 NOTE — ED Triage Notes (Signed)
 Patient c/o centralized chest pain, burning and weakness x 2 days.  Denies any SOB.  No history of acid reflux.  Decrease appetite, able to keep fluids down, some nausea.  Patient denies any OTC pain meds.
